# Patient Record
Sex: Female | Born: 1995 | Race: White | Hispanic: No | Marital: Married | Smoking: Never smoker
Health system: Southern US, Community
[De-identification: ages and names within clinical notes are randomized; demographics above are authoritative.]

## PROBLEM LIST (undated history)

## (undated) DIAGNOSIS — F909 Attention-deficit hyperactivity disorder, unspecified type: Secondary | ICD-10-CM

## (undated) DIAGNOSIS — G43909 Migraine, unspecified, not intractable, without status migrainosus: Secondary | ICD-10-CM

## (undated) HISTORY — PX: TONSILLECTOMY: SUR1361

## (undated) HISTORY — PX: NO PAST SURGERIES: SHX2092

---
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
## 2013-02-28 ENCOUNTER — Emergency Department (HOSPITAL_COMMUNITY)
Admission: EM | Admit: 2013-02-28 | Discharge: 2013-03-01 | Disposition: A | Discharge status: Home / Self Care | Payer: BC Managed Care – PPO | Attending: Emergency Medicine | Admitting: Emergency Medicine

## 2013-02-28 ENCOUNTER — Encounter (HOSPITAL_COMMUNITY): Payer: Self-pay | Admitting: Emergency Medicine

## 2013-02-28 DIAGNOSIS — F909 Attention-deficit hyperactivity disorder, unspecified type: Secondary | ICD-10-CM | POA: Insufficient documentation

## 2013-02-28 DIAGNOSIS — R000 Tachycardia, unspecified: Secondary | ICD-10-CM | POA: Insufficient documentation

## 2013-02-28 DIAGNOSIS — F410 Panic disorder [episodic paroxysmal anxiety] without agoraphobia: Secondary | ICD-10-CM | POA: Insufficient documentation

## 2013-02-28 DIAGNOSIS — G43909 Migraine, unspecified, not intractable, without status migrainosus: Secondary | ICD-10-CM | POA: Insufficient documentation

## 2013-02-28 DIAGNOSIS — Z3202 Encounter for pregnancy test, result negative: Secondary | ICD-10-CM | POA: Insufficient documentation

## 2013-02-28 DIAGNOSIS — F1210 Cannabis abuse, uncomplicated: Secondary | ICD-10-CM | POA: Insufficient documentation

## 2013-02-28 DIAGNOSIS — Z79899 Other long term (current) drug therapy: Secondary | ICD-10-CM | POA: Insufficient documentation

## 2013-02-28 HISTORY — DX: Migraine, unspecified, not intractable, without status migrainosus: G43.909

## 2013-02-28 HISTORY — DX: Attention-deficit hyperactivity disorder, unspecified type: F90.9

## 2013-02-28 LAB — POCT I-STAT, CHEM 8
BUN: 12 mg/dL (ref 6–23)
Calcium, Ion: 1.17 mmol/L (ref 1.12–1.23)
Chloride: 107 mEq/L (ref 96–112)
Creatinine, Ser: 1 mg/dL (ref 0.50–1.10)
Glucose, Bld: 101 mg/dL — ABNORMAL HIGH (ref 70–99)
HCT: 45 % (ref 36.0–46.0)
Hemoglobin: 15.3 g/dL — ABNORMAL HIGH (ref 12.0–15.0)
Potassium: 3.5 mEq/L (ref 3.5–5.1)
Sodium: 142 mEq/L (ref 135–145)
TCO2: 19 mmol/L (ref 0–100)

## 2013-02-28 LAB — POCT PREGNANCY, URINE: Preg Test, Ur: NEGATIVE

## 2013-02-28 MED ORDER — LORAZEPAM 1 MG PO TABS
1.0000 mg | ORAL_TABLET | Freq: Once | ORAL | Status: AC
  Administered 2013-02-28: 1 mg via ORAL
  Filled 2013-02-28: qty 1

## 2013-02-28 MED ORDER — SODIUM CHLORIDE 0.9 % IV BOLUS (SEPSIS)
1000.0000 mL | Freq: Once | INTRAVENOUS | Status: AC
  Administered 2013-02-28: 1000 mL via INTRAVENOUS

## 2013-02-28 NOTE — ED Provider Notes (Signed)
CSN: [id number]     Arrival date & time 02/28/13  2218 History   First MD Initiated Contact with Patient 02/28/13 2300     Chief Complaint  Patient presents with  . Hyperventilating   (Consider location/radiation/quality/duration/timing/severity/associated sxs/prior Treatment) HPI  17 year old female with history of ADHD presents for evaluations of hyperventilating. Patient reports for the past 3 days she has had intermittent sensation of heart racing, chest discomfort, and difficulty catching her breath and having panic attack.  these episodes usually happen after she smoked marijuana.  Usually an episode would last for half an hr and resolved but this time it prolonged for several hrs.  She has been smoking the same batch of weed and unsure if it was laced.  She has been taking her Vivanse medication which were prescribed to her.  She denies alcohol abuse or other rec drug use.  Denies family hx of premature cardiac death, denies exertional syncope.  No hx of birth control use, recent surgery, prolonged bed rest, cancer, or prior PE.  Pt also report not eating and drinking as much lately, but denies abd pain.  Denies fever, chills, productive cough, hemoptysis, dysuria, or rash.    Past Medical History  Diagnosis Date  . ADHD (attention deficit hyperactivity disorder)   . Migraines    History reviewed. No pertinent past surgical history. No family history on file. History  Substance Use Topics  . Smoking status: Never Smoker   . Smokeless tobacco: Never Used  . Alcohol Use: No   OB History   Grav Para Term Preterm Abortions TAB SAB Ect Mult Living                 Review of Systems  All other systems reviewed and are negative.    Allergies  Codeine  Home Medications   Current Outpatient Rx  Name  Route  Sig  Dispense  Refill  . amitriptyline (ELAVIL) 50 MG tablet   Oral   Take 50 mg by mouth at bedtime.         Marland Kitchen lisdexamfetamine (VYVANSE) 60 MG capsule   Oral  Take 60 mg by mouth every morning.         . rizatriptan (MAXALT) 10 MG tablet   Oral   Take 10 mg by mouth as needed for migraine. May repeat in 2 hours if needed          BP 126/84  Pulse 124  Resp 42  Ht 5\' 4"  (1.626 m)  Wt 116 lb (52.617 kg)  BMI 19.90 kg/m2  SpO2 100%  LMP 02/26/2013 Physical Exam  Nursing note and vitals reviewed. Constitutional: She is oriented to person, place, and time. She appears well-developed and well-nourished. No distress (appears uncomfortable, anxious).  HENT:  Head: Atraumatic.  Eyes: Conjunctivae are normal.  Neck: Neck supple.  Cardiovascular: Intact distal pulses.   Tachypneic, tachycardic without M/R/G  Pulmonary/Chest:  Tachypneic, but without R/R/W  Abdominal: Soft. She exhibits no distension. There is no tenderness. There is no rebound.  Musculoskeletal: She exhibits no edema.  Neurological: She is alert and oriented to person, place, and time.  Skin: No rash noted.  Psychiatric: She has a normal mood and affect.    ED Course  Procedures (including critical care time)   Date: 03/01/2013  Rate: 120  Rhythm: sinus tachycardia  QRS Axis: normal  Intervals: normal  ST/T Wave abnormalities: normal  Conduction Disutrbances:none  Narrative Interpretation:   Old EKG Reviewed: none available  11:34 PM Pt with anxiety/panic attack and is tachycardic after smoking weed. Ativan given.  ?laced marijuana.  Will check pregnancy test, H&H, UDS and give IVF while monitoring pt closely.  Low suspicion for PE or dissection as a cause.  Sxs may also be related to taking vyvanse however pt has been taking this medication without side effects.    12:19 AM EKG shows sinus tach only with no evidence of arrhythmia. Pregnancy test is negative. Normal H&H and normal electrolytes. Patient now is back to her normal baseline and felt much better. Moderate time was spent discussing avoidance of marijuana use.  Pt also need to f/u with PCP for  thyroid check.  Return precaution discussed.  Care discussed with Dr. Dierdre Highman.    Labs Review Labs Reviewed  POCT I-STAT, CHEM 8 - Abnormal; Notable for the following:    Glucose, Bld 101 (*)    Hemoglobin 15.3 (*)    All other components within normal limits  URINE RAPID DRUG SCREEN (HOSP PERFORMED)  POCT PREGNANCY, URINE   Imaging Review No results found.  EKG Interpretation   None       MDM   1. Tachycardia   2. Panic attack    BP 126/84  Pulse 124  Resp 42  Ht 5\' 4"  (1.626 m)  Wt 116 lb (52.617 kg)  BMI 19.90 kg/m2  SpO2 100%  LMP 02/26/2013     Fayrene Helper, PA-C 03/01/13 0030

## 2013-02-28 NOTE — ED Notes (Signed)
Pt's friend states that the pt began hyperventilating when she left his house. She called him panicked. Pt takes Vyvance daily. States that pt smoked marijuana and that this has happened before.

## 2013-03-01 LAB — RAPID URINE DRUG SCREEN, HOSP PERFORMED
Amphetamines: NOT DETECTED
Barbiturates: NOT DETECTED
Benzodiazepines: NOT DETECTED
Cocaine: NOT DETECTED
Opiates: NOT DETECTED
Tetrahydrocannabinol: NOT DETECTED

## 2013-03-01 NOTE — ED Provider Notes (Signed)
Medical screening examination/treatment/procedure(s) were performed by non-physician practitioner and as supervising physician I was immediately available for consultation/collaboration.    Sunnie Nielsen, MD 03/01/13 [phone]

## 2013-10-13 ENCOUNTER — Encounter: Payer: Self-pay | Admitting: Sports Medicine

## 2013-10-13 ENCOUNTER — Ambulatory Visit (INDEPENDENT_AMBULATORY_CARE_PROVIDER_SITE_OTHER): Payer: BC Managed Care – PPO | Admitting: Sports Medicine

## 2013-10-13 VITALS — BP 116/72 | Ht 64.0 in | Wt 125.0 lb

## 2013-10-13 DIAGNOSIS — M546 Pain in thoracic spine: Secondary | ICD-10-CM | POA: Insufficient documentation

## 2013-10-13 MED ORDER — CYCLOBENZAPRINE HCL 10 MG PO TABS: 10.0000 mg | ORAL_TABLET | Freq: Every evening | ORAL | Status: DC | PRN

## 2013-10-13 NOTE — Patient Instructions (Signed)
Use aleve for pain if needed  Use flexeril at night unless side effects - for at least 2 weeks  Let's try manipulation for upper back  Heat is probably more helpful than ice at this phase  Start scapular exercises

## 2013-10-13 NOTE — Assessment & Plan Note (Signed)
I believe this is facet joint related in the thoracic spine and may respond to manipulation  I will bring her back to a manipulation clinic  We will start her with some scapular stabilization exercises  Trial on Flexeril at night
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
## 2013-11-12 ENCOUNTER — Ambulatory Visit: Payer: BC Managed Care – PPO | Admitting: Sports Medicine

## 2013-11-20 ENCOUNTER — Encounter: Payer: Self-pay | Admitting: Sports Medicine

## 2013-11-20 ENCOUNTER — Ambulatory Visit (INDEPENDENT_AMBULATORY_CARE_PROVIDER_SITE_OTHER): Payer: BC Managed Care – PPO | Admitting: Sports Medicine

## 2013-11-20 VITALS — BP 122/81 | HR 92 | Ht 64.0 in | Wt 120.0 lb

## 2013-11-20 DIAGNOSIS — M546 Pain in thoracic spine: Secondary | ICD-10-CM | POA: Diagnosis not present

## 2013-11-20 DIAGNOSIS — M9902 Segmental and somatic dysfunction of thoracic region: Secondary | ICD-10-CM

## 2013-11-20 NOTE — Patient Instructions (Signed)
OMT Exam: T4 extended, rotated and sidebent right, T9 extended, rotated and sidebent right. Treated with soft tissue, muscle energy, and HVLA to thoracic with good relief of symptoms. Plan for follow-up with PT and/or home stretching regimen, low weight/high repetition upper back strengthening.  Muscle Spasms Muscle cramps and spasms occur when a muscle or muscles tighten and you have no control over this tightening (involuntary muscle contraction). They are a common problem and can develop in any muscle.  Muscle spasms may or may not be painful. They may also last just a few seconds or much longer. CAUSES  It is uncommon for spasms to be due to a serious underlying problem. In many cases, the cause of  spasm is unknown, but can be due to these factors. Some common causes are:   Overexertion.   Overuse from repetitive motions (doing the same thing over and over).   Remaining in a certain position for a long period of time.   Improper preparation, form, or technique while performing a sport or activity.   Dehydration.   Injury.   Side effects of some medicines.   Abnormally low levels of the salts and ions in your blood (electrolytes), especially potassium and calcium. This could happen if you are taking water pills (diuretics) or you are pregnant.  Some underlying medical problems can make it more likely to develop cramps or spasms. These include, but are not limited to:   Diabetes.   Parkinson disease.   Hormone disorders, such as thyroid problems.   Alcohol abuse.   Diseases specific to muscles, joints, and bones.   Blood vessel disease where not enough blood is getting to the muscles.  HOME CARE INSTRUCTIONS   Stay well hydrated. Drink enough water and fluids to keep your urine clear or pale yellow.  It may be helpful to massage, stretch, and relax the affected muscle.  For tight or tense muscles, use a warm towel, heating pad, or hot shower water directed  to the affected area.  If you are sore or have pain after a cramp or spasm, applying ice to the affected area may relieve discomfort.  Put ice in a plastic bag.  Place a towel between your skin and the bag.  Leave the ice on for 15-20 minutes, 03-04 times a day.  Medicines used to treat a known cause of cramps or spasms may help reduce their frequency or severity. Only take over-the-counter or prescription medicines as directed by your caregiver. SEEK MEDICAL CARE IF:  Your cramps or spasms get more severe, more frequent, or do not improve over time.  MAKE SURE YOU:   Understand these instructions.  Will watch your condition.  Will get help right away if you are not doing well or get worse. Document Released: 09/08/2001 Document Revised: 07/14/2012 Document Reviewed: 03/05/2012 [facility] Patient Information 2015 [employer], [place]. This information is not intended to replace advice given to you by your health care provider. Make sure you discuss any questions you have with your health care provider.
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
## 2017-03-18 ENCOUNTER — Telehealth: Payer: Self-pay | Admitting: *Deleted

## 2017-03-18 ENCOUNTER — Ambulatory Visit: Payer: Self-pay | Admitting: Neurology

## 2017-03-18 NOTE — Telephone Encounter (Signed)
Pt no show new pt appt on 03/18/2017 @ 10:00

## 2017-03-27 ENCOUNTER — Encounter: Payer: Self-pay | Admitting: Neurology

## 2020-05-24 ENCOUNTER — Telehealth: Payer: Self-pay | Admitting: Neurology

## 2020-05-24 NOTE — Telephone Encounter (Signed)
Jade Curtis, Dr. Kateri Plummer has referred patient to me for migraines. Can we get her set up sometime in the next month? 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 If I don't have anything (check my Monday afternoon emg spots) then let me know thanks!

## 2020-05-25 NOTE — Telephone Encounter (Signed)
You can just set up the appointment, I don;t need the notes it is fine thanks

## 2020-05-25 NOTE — Telephone Encounter (Signed)
Thank you :)

## 2020-06-13 ENCOUNTER — Encounter: Payer: Self-pay | Admitting: Neurology

## 2020-06-13 ENCOUNTER — Telehealth: Payer: Self-pay | Admitting: Neurology

## 2020-06-13 ENCOUNTER — Ambulatory Visit: Payer: BC Managed Care – PPO | Admitting: Neurology

## 2020-06-13 VITALS — BP 123/69 | HR 95 | Ht 64.0 in | Wt 133.0 lb

## 2020-06-13 DIAGNOSIS — G43709 Chronic migraine without aura, not intractable, without status migrainosus: Secondary | ICD-10-CM | POA: Diagnosis not present

## 2020-06-13 MED ORDER — RIZATRIPTAN BENZOATE 10 MG PO TABS: 10.0000 mg | ORAL_TABLET | ORAL | 11 refills | Status: DC | PRN

## 2020-06-13 NOTE — Telephone Encounter (Signed)
Please initiate botox for migraine approval. She can stay with me for the procedure. G43.709

## 2020-06-13 NOTE — Patient Instructions (Signed)
OnabotulinumtoxinA injection (Medical Use) What is this medicine? ONABOTULINUMTOXINA (o na BOTT you lye num tox in eh) is a neuro-muscular blocker. This medicine is used to treat crossed eyes, eyelid spasms, severe neck muscle spasms, ankle and toe muscle spasms, and elbow, wrist, and finger muscle spasms. It is also used to treat excessive underarm sweating, to prevent chronic migraine headaches, and to treat loss of bladder control due to neurologic conditions such as multiple sclerosis or spinal cord injury. This medicine may be used for other purposes; ask your health care provider or pharmacist if you have questions. COMMON BRAND NAME(S): Botox What should I tell my health care provider before I take this medicine? They need to know if you have any of these conditions:  breathing problems  cerebral palsy spasms  difficulty urinating  heart problems  history of surgery where this medicine is going to be used  infection at the site where this medicine is going to be used  myasthenia gravis or other neurologic disease  nerve or muscle disease  surgery plans  take medicines that treat or prevent blood clots  thyroid problems  an unusual or allergic reaction to botulinum toxin, albumin, other medicines, foods, dyes, or preservatives  pregnant or trying to get pregnant  breast-feeding How should I use this medicine? This medicine is for injection into a muscle. It is given by a health care professional in a hospital or clinic setting. Talk to your pediatrician regarding the use of this medicine in children. While this drug may be prescribed for children as young as 2 years old for selected conditions, precautions do apply. Overdosage: If you think you have taken too much of this medicine contact a poison control center or emergency room at once. NOTE: This medicine is only for you. Do not share this medicine with others. What if I miss a dose? This does not apply. What may  interact with this medicine?  aminoglycoside antibiotics like gentamicin, neomycin, tobramycin  muscle relaxants  other botulinum toxin injections This list may not describe all possible interactions. Give your health care provider a list of all the medicines, herbs, non-prescription drugs, or dietary supplements you use. Also tell them if you smoke, drink alcohol, or use illegal drugs. Some items may interact with your medicine. What should I watch for while using this medicine? Visit your doctor for regular check ups. This medicine will cause weakness in the muscle where it is injected. Tell your doctor if you feel unusually weak in other muscles. Get medical help right away if you have problems with breathing, swallowing, or talking. This medicine might make your eyelids droop or make you see blurry or double. If you have weak muscles or trouble seeing do not drive a car, use machinery, or do other dangerous activities. This medicine contains albumin from human blood. It may be possible to pass an infection in this medicine, but no cases have been reported. Talk to your doctor about the risks and benefits of this medicine. If your activities have been limited by your condition, go back to your regular routine slowly after treatment with this medicine. What side effects may I notice from receiving this medicine? Side effects that you should report to your doctor or health care professional as soon as possible:  allergic reactions like skin rash, itching or hives, swelling of the face, lips, or tongue  breathing problems  changes in vision  chest pain or tightness  eye irritation, pain  fast, irregular heartbeat    infection  numbness  speech problems  swallowing problems  unusual weakness Side effects that usually do not require medical attention (report to your doctor or health care professional if they continue or are bothersome):  bruising or pain at site where  injected  drooping eyelid  dry eyes or mouth  headache  muscles aches, pains  sensitivity to light  tearing This list may not describe all possible side effects. Call your doctor for medical advice about side effects. You may report side effects to FDA at 1-800-FDA-1088. Where should I keep my medicine? This drug is given in a hospital or clinic and will not be stored at home. NOTE: This sheet is a summary. It may not cover all possible information. If you have questions about this medicine, talk to your doctor, pharmacist, or health care provider.  2021 Elsevier/Gold Standard (2017-09-23 14:21:42)  

## 2020-06-13 NOTE — Progress Notes (Signed)
[facility]    Provider:  Dr Lucia Gaskins Requesting Provider: No ref. provider found Primary Care Provider:  Patient, [name]  CC:  Migraines  HPI:  Jade Curtis is a 25 y.o. female here as requested by No ref. provider found for migraines. Since a child. They were wors ein elementary school, vomiting, went to many urgent cares as a child. Over the years she ahs learned how to handle it better. She tries to drink water. She had to take a maxalt every day last week, weather triggered more migraines, also her period can trigger a migraine. Starts unilaterally, pulsating/pounding/throbbing, osmophobia, nausea, can last 24 hours, a strong scent can make it worse, she used to vomit. Over the last 6 months, she has 15 headache days a month and 8 migraine days a month that are moderately severe to severe, no aura, no medication overuse. She is not planning on children anytime soon, however she is no on birth control and so this may limit our choice of medications. No other focal neurologic deficits, associated symptoms, inciting events or modifiable factors.No vision changes. Not positional. No new symptoms, quality stable.   She has tried Topamax for several years with side effects, Depakote for several years(gained excessive weight), Zomig, Maxalt (still takes and it works well for her), Imitrex nasal spray, Ibuprofen, Tylenol, Excedrin migraine, Aleve, Tylenol cold and sinus, Sudafed, amitriptyline(excessive fatigue), diclofenac, flexeril, verapamil(side effect hypotension), propranolol (side effect hypotension), Ajovy/Aimovig/Emgality contraindicated as she is not on birth control and sexually active  Reviewed notes, labs and imaging from outside physicians, which showed: see above  Review of Systems: Patient complains of symptoms per HPI as well as the following symptoms: headache. Pertinent negatives and positives per HPI. All others negative.   Social History    Socioeconomic History  . Marital status: Single    Spouse name: Not on file  . Number of children: Not on file  . Years of education: Not on file  . Highest education level: Not on file  Occupational History  . Not on file  Tobacco Use  . Smoking status: Never Smoker  . Smokeless tobacco: Never Used  Vaping Use  . Vaping Use: Never used  Substance and Sexual Activity  . Alcohol use: Yes    Comment: occasionally   . Drug use: Not Currently    Types: Marijuana  . Sexual activity: Not on file  Other Topics Concern  . Not on file  Social History Narrative   Lives with her fiance'    Right handed   Caffeine: red bull x 1 per day (or similar drink)   Social Determinants of Health   Financial Resource Strain: Not on file  Food Insecurity: Not on file  Transportation Needs: Not on file  Physical Activity: Not on file  Stress: Not on file  Social Connections: Not on file  Intimate Partner Violence: Not on file    Family History  Problem Relation Age of Onset  . Migraines Mother   . Kidney disease Father   . Migraines Maternal Grandmother     Past Medical History:  Diagnosis Date  . ADHD (attention deficit hyperactivity disorder)   . Migraines     Patient Active Problem List   Diagnosis Date Noted  . Chronic migraine without aura without status migrainosus, not intractable 06/13/2020  . Midline thoracic back pain 10/13/2013  . Calf tenderness 08/08/2010  . FOOT PAIN, BILATERAL 12/15/2008  . GENU VALGUM 12/15/2008  . ABNORMALITY OF GAIT  12/15/2008    Past Surgical History:  Procedure Laterality Date  . NO PAST SURGERIES      Current Outpatient Medications  Medication Sig Dispense Refill  . amphetamine-dextroamphetamine (ADDERALL XR) 30 MG 24 hr capsule Take 30 mg by mouth daily.    Marland Kitchen amphetamine-dextroamphetamine (ADDERALL) 15 MG tablet Take 1 tablet by mouth daily.    Marland Kitchen doxycycline (VIBRA-TABS) 100 MG tablet Take 100 mg by mouth daily.    Marland Kitchen guanFACINE  (TENEX) 1 MG tablet Take 1 mg by mouth daily.    Marland Kitchen spironolactone (ALDACTONE) 100 MG tablet Take 100 mg by mouth daily.    . rizatriptan (MAXALT) 10 MG tablet Take 1 tablet (10 mg total) by mouth as needed for migraine. May repeat in 2 hours if needed 9 tablet 11   No current facility-administered medications for this visit.    Allergies as of 06/13/2020 - Review Complete 06/13/2020  Allergen Reaction Noted  . Codeine  02/28/2013    Vitals: BP 123/69 (BP Location: Right Arm, Patient Position: Sitting)   Pulse 95   Ht 5\' 4"  (1.626 m)   Wt 133 lb (60.3 kg)   BMI 22.83 kg/m  Last Weight:  Wt Readings from Last 1 Encounters:  06/13/20 133 lb (60.3 kg)   Last Height:   Ht Readings from Last 1 Encounters:  06/13/20 5\' 4"  (1.626 m)     Physical exam: Exam: Gen: NAD, conversant, well nourised, well groomed                     CV: RRR, no MRG. No Carotid Bruits. No peripheral edema, warm, nontender Eyes: Conjunctivae clear without exudates or hemorrhage  Neuro: Detailed Neurologic Exam  Speech:    Speech is normal; fluent and spontaneous with normal comprehension.  Cognition:    The patient is oriented to person, place, and time;     recent and remote memory intact;     language fluent;     normal attention, concentration,     fund of knowledge Cranial Nerves:    The pupils are equal, round, and reactive to light. The fundi are flat. Visual fields are full to finger confrontation. Extraocular movements are intact. Trigeminal sensation is intact and the muscles of mastication are normal. The face is symmetric. The palate elevates in the midline. Hearing intact. Voice is normal. Shoulder shrug is normal. The tongue has normal motion without fasciculations.   Coordination:    No dysmetria or ataxia  Gait:    Normal native gait  Motor Observation:    No asymmetry, no atrophy, and no involuntary movements noted. Tone:    Normal muscle tone.    Posture:    Posture is  normal. normal erect    Strength:    Strength is V/V in the upper and lower limbs.      Sensation: intact to LT     Reflex Exam:  DTR's:    Deep tendon reflexes in the upper and lower extremities are normal bilaterally.   Toes:    The toes are downgoing bilaterally.   Clonus:    Clonus is absent.    Assessment/Plan:  26 year old with chronic migraines failed multiple medications. We discussed options. Limiting factor is that although she is not planning on children anytime soon she is also not using birth control due to side effects to birth control. In that case the safest option would be botox for migraines.  Botox: prevention Acute: maxalt, continue No  indication for imaging  No orders of the defined types were placed in this encounter.  Meds ordered this encounter  Medications  . rizatriptan (MAXALT) 10 MG tablet    Sig: Take 1 tablet (10 mg total) by mouth as needed for migraine. May repeat in 2 hours if needed    Dispense:  9 tablet    Refill:  11    Dispense 9 or 18 or max allowed by insurance    Cc: No ref. provider found,  Patient, [name]  I spent 45 minutes of face-to-face and non-face-to-face time with patient on the  1. Chronic migraine without aura without status migrainosus, not intractable    diagnosis.  This included previsit chart review, lab review, study review, order entry, electronic health record documentation, patient education on the different diagnostic and therapeutic options, counseling and coordination of care, risks and benefits of management, compliance, or risk factor reduction  Naomie Dean, MD  [facility] [address] [place], [place] [address]  Phone [phone] Fax [phone]

## 2020-06-14 NOTE — Telephone Encounter (Signed)
Received charge sheet for Botox. Filled out [employer] PA form and gave to MD to sign.

## 2020-06-14 NOTE — Telephone Encounter (Signed)
Botox charge sheet completed and is pending MD signature. Pt needs to sign consent at first injection appt. 

## 2020-06-14 NOTE — Telephone Encounter (Signed)
Faxed signed PA form to [employer] with notes. 

## 2020-06-16 NOTE — Telephone Encounter (Signed)
Jade Curtis: Let patient know that insurance will not approve botox until she has tried the CGRP medications. But she is not using birth control and this is risky because we do not have much information on effects of these medications on a fetus. We could try Qulipta but patient would need to use some sort of birth control (even condoms or a barrier device).   [name] - does qulipta count or does it have to be ajovy/emgality/aimovig?

## 2020-06-16 NOTE — Telephone Encounter (Signed)
Received denial from [employer] stating "This medication may be covered when the member has tried and failed therapy with a CGRP inhibitor such as Emgality or Aimovig to prevent migraines. In this case, medical records do not confirm the member has tried and failed a CGRP."  Patient's office notes from 3/14 were included. The office notes state that a CGRP Inhibitor contraindicates as patient is not on birth control and is sexually active.  Please advise how to proceed.

## 2020-06-20 NOTE — Telephone Encounter (Signed)
The letter indicates Ajovy/Aimovig/Emgality

## 2020-06-20 NOTE — Telephone Encounter (Signed)
Spoke with patient we discussed the options given Botox has been denied by her insurance and the requirement is to try [employer] or Aimovig.  Patient is aware she would need to use some sort of birth control to be on the CGRP injection. The patient's questions were answered and she is going to think about it and give [place] a call back probably tomorrow.

## 2020-09-08 ENCOUNTER — Telehealth: Payer: Self-pay | Admitting: Neurology

## 2020-09-08 DIAGNOSIS — G43709 Chronic migraine without aura, not intractable, without status migrainosus: Secondary | ICD-10-CM

## 2020-09-08 NOTE — Telephone Encounter (Signed)
Pt has called for a refill on her Nurtec to [facility] [phone]

## 2020-09-12 MED ORDER — NURTEC 75 MG PO TBDP: 75.0000 mg | ORAL_TABLET | Freq: Every day | ORAL | 2 refills | Status: DC | PRN

## 2020-09-12 NOTE — Telephone Encounter (Signed)
Spoke with Dr Lucia Gaskins. Ok to provide prescription for Nurtec 75 mg PRN use. Will send to [facility] pharmacy. I spoke with the patient and discussed the plan. She is aware to watch for a call from [facility] pharmacy Nurtec [employer] program. She verbalized appreciation for the call. Nurtec Rx sent to [facility] pharmacy.

## 2020-09-12 NOTE — Addendum Note (Signed)
Addended by: Bertram Savin on: 09/12/2020 01:36 PM   Modules accepted: Orders

## 2020-10-11 ENCOUNTER — Telehealth: Payer: Self-pay | Admitting: *Deleted

## 2020-10-11 NOTE — Telephone Encounter (Signed)
Received PA form for Nurtec from [facility] pharmacy. PA form completed, signed, and faxed to [facility] pharmacy. Received a receipt of confirmation.

## 2020-10-13 NOTE — Telephone Encounter (Signed)
Received approval letter from [facility]. Nurtec approved through 10/12/21. Faxed approval letter to [facility] pharmacy. Received a receipt of confirmation.

## 2020-11-23 ENCOUNTER — Other Ambulatory Visit: Payer: Self-pay | Admitting: Neurology

## 2020-11-23 DIAGNOSIS — G43709 Chronic migraine without aura, not intractable, without status migrainosus: Secondary | ICD-10-CM

## 2020-11-24 ENCOUNTER — Other Ambulatory Visit: Payer: Self-pay | Admitting: *Deleted

## 2020-11-24 MED ORDER — NURTEC 75 MG PO TBDP: ORAL_TABLET | ORAL | 3 refills | Status: DC

## 2021-06-27 ENCOUNTER — Other Ambulatory Visit: Payer: Self-pay | Admitting: Neurology

## 2021-07-25 ENCOUNTER — Other Ambulatory Visit: Payer: Self-pay | Admitting: Neurology

## 2021-08-10 ENCOUNTER — Telehealth: Payer: Self-pay | Admitting: Neurology

## 2021-08-10 MED ORDER — RIZATRIPTAN BENZOATE 10 MG PO TABS
ORAL_TABLET | ORAL | 0 refills | Status: DC
Start: 2021-08-10 — End: 2021-09-07

## 2021-08-10 NOTE — Telephone Encounter (Signed)
Pt request refill for rizatriptan (MAXALT) 10 MG tablet at [facility] [phone] ?

## 2021-08-10 NOTE — Telephone Encounter (Signed)
Done

## 2021-09-07 ENCOUNTER — Other Ambulatory Visit: Payer: Self-pay | Admitting: Neurology

## 2021-10-12 ENCOUNTER — Telehealth: Payer: Self-pay | Admitting: Neurology

## 2021-10-12 ENCOUNTER — Ambulatory Visit: Payer: BC Managed Care – PPO | Admitting: Neurology

## 2021-10-12 NOTE — Telephone Encounter (Signed)
LVM and mychart msg informing pt of need to reschedule 7/13 appointment - MD out 

## 2021-10-31 LAB — OB RESULTS CONSOLE ABO/RH: RH Type: NEGATIVE

## 2021-10-31 LAB — HEPATITIS C ANTIBODY: HCV Ab: NEGATIVE

## 2021-10-31 LAB — OB RESULTS CONSOLE RPR: RPR: NONREACTIVE

## 2021-10-31 LAB — OB RESULTS CONSOLE ANTIBODY SCREEN: Antibody Screen: NEGATIVE

## 2021-10-31 LAB — OB RESULTS CONSOLE GC/CHLAMYDIA
Chlamydia: NEGATIVE
Neisseria Gonorrhea: NEGATIVE

## 2021-10-31 LAB — OB RESULTS CONSOLE RUBELLA ANTIBODY, IGM: Rubella: IMMUNE

## 2021-10-31 LAB — OB RESULTS CONSOLE HIV ANTIBODY (ROUTINE TESTING): HIV: NONREACTIVE

## 2021-10-31 LAB — OB RESULTS CONSOLE HEPATITIS B SURFACE ANTIGEN: Hepatitis B Surface Ag: NEGATIVE

## 2022-01-01 ENCOUNTER — Encounter: Payer: Self-pay | Admitting: Neurology

## 2022-01-01 ENCOUNTER — Ambulatory Visit: Payer: BC Managed Care – PPO | Admitting: Neurology

## 2022-01-01 VITALS — BP 119/72 | HR 88 | Ht 64.0 in | Wt 188.0 lb

## 2022-01-01 DIAGNOSIS — G43709 Chronic migraine without aura, not intractable, without status migrainosus: Secondary | ICD-10-CM

## 2022-01-01 MED ORDER — METOCLOPRAMIDE HCL 10 MG PO TABS: 10.0000 mg | ORAL_TABLET | Freq: Four times a day (QID) | ORAL | 3 refills | Status: AC

## 2022-01-01 NOTE — Patient Instructions (Signed)
Metoclopramide Tablets What is this medication? METOCLOPRAMIDE (met oh kloe PRA mide) treats reflux disease. It is prescribed when other medications have not worked. It may also be used to treat slow emptying of the digestive tract. It works by helping the muscles in your digestive tract move food. This empties your digestive tract, which relieves symptoms such as fullness, nausea, and heartburn. This medicine may be used for other purposes; ask your health care provider or pharmacist if you have questions. COMMON BRAND NAME(S): Reglan What should I tell my care team before I take this medication? They need to know if you have any of these conditions: Breast cancer Depression Diabetes Frequently drink alcohol Heart failure High blood pressure Kidney disease Liver disease Parkinson's disease or a movement disorder Pheochromocytoma Seizures Stomach obstruction, bleeding, or perforation An unusual or allergic reaction to metoclopramide, procainamide, other medications, foods, dyes, or preservatives Pregnant or trying to get pregnant Breast-feeding How should I use this medication? Take this medication by mouth with a glass of water. Follow the directions on the prescription label. Take this medication on an empty stomach, about 30 minutes before eating. Take your doses at regular intervals. Do not take your medication more often than directed. Do not stop taking except on the advice of your care team. A special MedGuide will be given to you by the pharmacist with each prescription and refill. Be sure to read this information carefully each time. Talk to your care team about the use of this medication in children. Special care may be needed. Overdosage: If you think you have taken too much of this medicine contact a poison control center or emergency room at once. NOTE: This medicine is only for you. Do not share this medicine with others. What if I miss a dose? If you miss a dose, skip it.  Take your next dose at the normal time. Do not take extra or 2 doses at the same time to make up for the missed dose. What may interact with this medication? Alcohol Antihistamines for allergy, cough, and cold Atovaquone Atropine Bupropion Certain medications for anxiety or sleep Certain medications for bladder problems, such as oxybutynin, tolterodine Certain medications for depression or mental health conditions Certain medications for Parkinson's disease Certain medications for seizures, such as phenobarbital, primidone Certain medications for stomach problems, such as dicyclomine, hyoscyamine Certain medications for travel sickness, such as scopolamine Cyclosporine Digoxin Fosfomycin General anesthetics, such as halothane, isoflurane, methoxyflurane, propofol Insulin and other medications for diabetes Ipratropium MAOIs, such as Carbex, Eldepryl, Marplan, Nardil, and Parnate Medications that relax muscles for surgery Opioid medications for pain Paroxetine Phenothiazines, such as chlorpromazine, mesoridazine, prochlorperazine, thioridazine Posaconazole Quinidine Sirolimus Tacrolimus This list may not describe all possible interactions. Give your health care provider a list of all the medicines, herbs, non-prescription drugs, or dietary supplements you use. Also tell them if you smoke, drink alcohol, or use illegal drugs. Some items may interact with your medicine. What should I watch for while using this medication? It may take a few weeks for your stomach condition to start to get better. However, do not take this medication for longer than 12 weeks. The longer you take this medication, and the more you take it, the greater your chances are of developing serious side effects. If you are over 59 years of age, a female patient, or you have diabetes, you may be at an increased risk for side effects from this medication. Contact your care team immediately if you start having movements  you cannot control such as lip smacking, rapid movements of the tongue, involuntary or uncontrollable movements of the eyes, head, arms and legs, or muscle twitches and spasms. Patients and their families should watch out for worsening depression or thoughts of suicide. Also watch out for any sudden or severe changes in feelings such as feeling anxious, agitated, panicky, irritable, hostile, aggressive, impulsive, severely restless, overly excited and hyperactive, or not being able to sleep. If this happens, especially at the beginning of treatment or after a change in dose, call your care team. Do not treat yourself for high fever. Ask your care team for advice. You may get drowsy or dizzy. Do not drive, use machinery, or do anything that needs mental alertness until you know how this medication affects you. Do not stand or sit up quickly, especially if you are over 42 years of age. This reduces the risk of dizzy or fainting spells. Alcohol can make you more drowsy and dizzy. Avoid alcoholic drinks. What side effects may I notice from receiving this medication? Side effects that you should report to your care team as soon as possible: Allergic reactions--skin rash, itching, hives, swelling of the face, lips, tongue, or throat High fever, stiff muscles, increased sweating, fast or irregular heartbeat, and confusion, which may be signs of neuroleptic malignant syndrome High prolactin level--unexpected breast tissue growth, discharge from the nipple, change in sex drive or performance, irregular menstrual cycle Increase in blood pressure Swelling of the ankles, hands, or feet Thoughts of suicide or self-harm, worsening mood, feelings of depression Uncontrolled and repetitive body movements, muscle stiffness or spasms, tremors or shaking, loss of balance or coordination, restlessness, shuffling walk, which may be signs of extrapyramidal symptoms (EPS) Side effects that usually do not require medical  attention (report to your care team if they continue or are bothersome): Dizziness Drowsiness Fatigue Headache Trouble sleeping This list may not describe all possible side effects. Call your doctor for medical advice about side effects. You may report side effects to FDA at 1-800-FDA-1088. Where should I keep my medication? Keep out of the reach of children and pets. Store at room temperature between 20 and 25 degrees C (68 and 77 degrees F). Protect from light. Keep container tightly closed. Throw away any unused medication after the expiration date. NOTE: This sheet is a summary. It may not cover all possible information. If you have questions about this medicine, talk to your doctor, pharmacist, or health care provider.  2023 Elsevier/Gold Standard (2020-11-23 00:00:00)

## 2022-01-01 NOTE — Progress Notes (Signed)
[facility]    Provider:  Dr Jaynee Eagles Requesting Provider: No ref. provider found Primary Care Provider:  Patient, [name]  CC:  Migraines  01/01/2022: She found out the middle of June she was pregnant. The first 2 weeks of July it was very bad but since then she has not really had a migraine.  HPI 06/13/2020:  Jade Curtis is a 26 y.o. female here as requested by No ref. provider found for migraines. Since a child. They were wors ein elementary school, vomiting, went to many urgent cares as a child. Over the years she ahs learned how to handle it better. She tries to drink water. She had to take a maxalt every day last week, weather triggered more migraines, also her period can trigger a migraine. Starts unilaterally, pulsating/pounding/throbbing, osmophobia, nausea, can last 24 hours, a strong scent can make it worse, she used to vomit. Over the last 6 months, she has 15 headache days a month and 8 migraine days a month that are moderately severe to severe, no aura, no medication overuse. She is not planning on children anytime soon, however she is no on birth control and so this may limit our choice of medications. No other focal neurologic deficits, associated symptoms, inciting events or modifiable factors.No vision changes. Not positional. No new symptoms, quality stable.   She has tried Topamax for several years with side effects, Depakote for several years(gained excessive weight), Zomig, Maxalt (still takes and it works well for her), Imitrex nasal spray, Ibuprofen, Tylenol, Excedrin migraine, Aleve, Tylenol cold and sinus, Sudafed, amitriptyline(excessive fatigue), diclofenac, flexeril, verapamil(side effect hypotension), propranolol (side effect hypotension), tried Ajovy, reglan, topiramate, nurtec, ubrelvy  Reviewed notes, labs and imaging from outside physicians, which showed: see above  Review of Systems: Patient complains of symptoms per HPI as well as the  following symptoms: headache. Pertinent negatives and positives per HPI. All others negative.   Social History   Socioeconomic History   Marital status: Married    Spouse name: Not on file   Number of children: Not on file   Years of education: Not on file   Highest education level: Not on file  Occupational History   Not on file  Tobacco Use   Smoking status: Never   Smokeless tobacco: Never  Vaping Use   Vaping Use: Never used  Substance and Sexual Activity   Alcohol use: Not Currently    Comment: occasionally    Drug use: Not Currently    Types: Marijuana   Sexual activity: Not on file  Other Topics Concern   Not on file  Social History Narrative   Lives with her husband    Right handed   Caffeine: coffee and sometimes diet coke in afternoon   Social Determinants of Health   Financial Resource Strain: Not on file  Food Insecurity: Not on file  Transportation Needs: Not on file  Physical Activity: Not on file  Stress: Not on file  Social Connections: Not on file  Intimate Partner Violence: Not on file    Family History  Problem Relation Age of Onset   Migraines Mother    Kidney disease Father    Migraines Maternal Grandmother     Past Medical History:  Diagnosis Date   ADHD (attention deficit hyperactivity disorder)    Migraines     Patient Active Problem List   Diagnosis Date Noted   Chronic migraine without aura without status migrainosus, not intractable 06/13/2020   Midline thoracic  back pain 10/13/2013   Calf tenderness 08/08/2010   FOOT PAIN, BILATERAL 12/15/2008   GENU VALGUM 12/15/2008   ABNORMALITY OF GAIT 12/15/2008    Past Surgical History:  Procedure Laterality Date   NO PAST SURGERIES      Current Outpatient Medications  Medication Sig Dispense Refill   Doxylamine Succinate, Sleep, (UNISOM PO) Take by mouth at bedtime as needed.     MAGNESIUM PO Take by mouth.     metoCLOPramide (REGLAN) 10 MG tablet Take 1 tablet (10 mg total)  by mouth 4 (four) times daily. As needed for migraine. 30 tablet 3   Prenatal Vit-Fe Fumarate-FA (PRENATAL VITAMINS PO) Take by mouth.     No current facility-administered medications for this visit.    Allergies as of 01/01/2022 - Review Complete 01/01/2022  Allergen Reaction Noted   Codeine  02/28/2013    Vitals: BP 119/72 (BP Location: Left Arm, Patient Position: Sitting)   Pulse 88   Ht 5\' 4"  (1.626 m)   Wt 188 lb (85.3 kg)   BMI 32.27 kg/m  Last Weight:  Wt Readings from Last 1 Encounters:  01/01/22 188 lb (85.3 kg)   Last Height:   Ht Readings from Last 1 Encounters:  01/01/22 5\' 4"  (1.626 m)    Exam: NAD, pleasant                  Speech:    Speech is normal; fluent and spontaneous with normal comprehension.  Cognition:    The patient is oriented to person, place, and time;     recent and remote memory intact;     language fluent;    Cranial Nerves:    The pupils are equal, round, and reactive to light.Trigeminal sensation is intact and the muscles of mastication are normal. The face is symmetric. The palate elevates in the midline. Hearing intact. Voice is normal. Shoulder shrug is normal. The tongue has normal motion without fasciculations.   Coordination:  No dysmetria  Motor Observation:    No asymmetry, no atrophy, and no involuntary movements noted. Tone:    Normal muscle tone.     Strength:    Strength is V/V in the upper and lower limbs.      Sensation: intact to LT    Assessment/Plan:  26 year old with chronic migraines failed multiple medications now [redacted] weeks pregnant. We discussed risks in pregnancy, since she is not having many headaches/migraines now (80% of woman in pregnancy have great 2nd and 3rd trimesters) we can give reglan, can also try cyproheptadine or sumatriptan in the future we discussed medication classes and risks.   She has tried Topamax for several years with side effects, Depakote for several years(gained excessive weight),  Zomig, Maxalt (still takes and it works well for her), Imitrex nasal spray, Ibuprofen, Tylenol, Excedrin migraine, Aleve, Tylenol cold and sinus, Sudafed, amitriptyline(excessive fatigue), diclofenac, flexeril, verapamil(side effect hypotension), propranolol (side effect hypotension), tried Ajovy, reglan, topiramate, nurtec(didn't feel it helpes), ubrelvy(she would try again)  In the future we will see her in mid march, if having migraines again she would be interested in botox, we can always try to provide samples while we get it approved  There are limited treatments in pregnancy and all have risks. Tylenol is generally accepted as safe in pregnancy although there has even been evidence for risks with tylenol. Discussed.  Other interventions to try and decreased the use of medication for headache/migraine include:  Cool Compress. Lie down and place a  cool compress on your head.  Avoid headache triggers. If certain foods or odors seem to have triggered your migraines in the past, avoid them. A headache diary might help you identify triggers.  Include physical activity in your daily routine. Try a daily walk or other moderate aerobic exercise.  Manage stress. Find healthy ways to cope with the stressors, such as delegating tasks on your to-do list.  Practice relaxation techniques. Try deep breathing, yoga, massage and visualization.  Eat regularly. Eating regularly scheduled meals and maintaining a healthy diet might help prevent headaches. Also, drink plenty of fluids.  Follow a regular sleep schedule. Sleep deprivation might contribute to headaches  Consider biofeedback. With this mind-body technique, you learn to control certain bodily functions -- such as muscle tension, heart rate and blood pressure -- to prevent headaches or reduce headache pain.  Headaches during pregnancy are common. However, if you develop a severe headache or a headache that doesn't go away, call your health care provider.  Severe headaches can be a sign of a pregnancy complication    Meds ordered this encounter  Medications   metoCLOPramide (REGLAN) 10 MG tablet    Sig: Take 1 tablet (10 mg total) by mouth 4 (four) times daily. As needed for migraine.    Dispense:  30 tablet    Refill:  3    Cc: No ref. provider found,  Patient, [name] Sarina Ill, MD  [facility] [address] [place], [place] [address]  I spent 30 minutes of face-to-face and non-face-to-face time with patient on the  1. Chronic migraine without aura without status migrainosus, not intractable    diagnosis.  This included previsit chart review, lab review, study review, order entry, electronic health record documentation, patient education on the different diagnostic and therapeutic options, counseling and coordination of care, risks and benefits of management, compliance, or risk factor reduction  Phone [phone] Fax [phone]

## 2022-04-02 NOTE — L&D Delivery Note (Addendum)
Delivery Note At 0225 a viable and healthy female was delivered over intact perineum via svd (Presentation:cephalic ;  OA).  APGAR: 8,9 ; weight  not yet done.   Placenta status: delivered spontaneously,intact .  Cord: 3vv with the following complications: nuchal x1, unable to easily reduce but not tight, baby continued to deliver so reduced after delivery w/o difficulty.  Anesthesia:  epidural Episiotomy:  none Lacerations:  small right labial, hemostatic; 2nd degree perineal Suture Repair: 2.0 vicryl Est. Blood Loss (mL):  163m  Mom to postpartum.  Baby to Couplet care / Skin to Skin.  SCharyl Bigger2/22/2024, 2:48 AM

## 2022-04-24 LAB — OB RESULTS CONSOLE GBS: GBS: NEGATIVE

## 2022-05-18 ENCOUNTER — Encounter (HOSPITAL_COMMUNITY): Payer: Self-pay | Admitting: *Deleted

## 2022-05-18 ENCOUNTER — Telehealth (HOSPITAL_COMMUNITY): Payer: Self-pay | Admitting: *Deleted

## 2022-05-18 NOTE — Telephone Encounter (Signed)
Preadmission screen  

## 2022-05-22 ENCOUNTER — Other Ambulatory Visit: Payer: Self-pay | Admitting: Obstetrics

## 2022-05-23 ENCOUNTER — Inpatient Hospital Stay (HOSPITAL_COMMUNITY)
Admission: AD | Admit: 2022-05-23 | Discharge: 2022-05-25 | DRG: 807 | Disposition: A | Discharge status: Home / Self Care | Payer: BC Managed Care – PPO | Attending: Obstetrics and Gynecology | Admitting: Obstetrics and Gynecology

## 2022-05-23 ENCOUNTER — Inpatient Hospital Stay (HOSPITAL_COMMUNITY): Payer: BC Managed Care – PPO | Admitting: Anesthesiology

## 2022-05-23 ENCOUNTER — Encounter (HOSPITAL_COMMUNITY): Payer: Self-pay | Admitting: Obstetrics and Gynecology

## 2022-05-23 DIAGNOSIS — O26893 Other specified pregnancy related conditions, third trimester: Secondary | ICD-10-CM | POA: Diagnosis present

## 2022-05-23 DIAGNOSIS — O480 Post-term pregnancy: Secondary | ICD-10-CM | POA: Diagnosis present

## 2022-05-23 DIAGNOSIS — Z8759 Personal history of other complications of pregnancy, childbirth and the puerperium: Secondary | ICD-10-CM

## 2022-05-23 DIAGNOSIS — Z6791 Unspecified blood type, Rh negative: Secondary | ICD-10-CM

## 2022-05-23 DIAGNOSIS — Z3A40 40 weeks gestation of pregnancy: Secondary | ICD-10-CM | POA: Diagnosis not present

## 2022-05-23 LAB — CBC
HCT: 35.5 % — ABNORMAL LOW (ref 36.0–46.0)
Hemoglobin: 12.1 g/dL (ref 12.0–15.0)
MCH: 30.7 pg (ref 26.0–34.0)
MCHC: 34.1 g/dL (ref 30.0–36.0)
MCV: 90.1 fL (ref 80.0–100.0)
Platelets: 260 10*3/uL (ref 150–400)
RBC: 3.94 MIL/uL (ref 3.87–5.11)
RDW: 12.3 % (ref 11.5–15.5)
WBC: 12.8 10*3/uL — ABNORMAL HIGH (ref 4.0–10.5)
nRBC: 0 % (ref 0.0–0.2)

## 2022-05-23 LAB — TYPE AND SCREEN
ABO/RH(D): A NEG
Antibody Screen: POSITIVE

## 2022-05-23 LAB — POCT FERN TEST: POCT Fern Test: POSITIVE

## 2022-05-23 MED ORDER — LACTATED RINGERS IV SOLN: INTRAVENOUS | Status: DC

## 2022-05-23 MED ORDER — EPHEDRINE 5 MG/ML INJ: 10.0000 mg | INTRAVENOUS | Status: DC | PRN

## 2022-05-23 MED ORDER — LACTATED RINGERS IV SOLN: 500.0000 mL | Freq: Once | INTRAVENOUS | Status: DC

## 2022-05-23 MED ORDER — DIPHENHYDRAMINE HCL 50 MG/ML IJ SOLN: 12.5000 mg | INTRAMUSCULAR | Status: DC | PRN

## 2022-05-23 MED ORDER — SOD CITRATE-CITRIC ACID 500-334 MG/5ML PO SOLN: 30.0000 mL | ORAL | Status: DC | PRN

## 2022-05-23 MED ORDER — LIDOCAINE HCL (PF) 1 % IJ SOLN: 30.0000 mL | INTRAMUSCULAR | Status: DC | PRN

## 2022-05-23 MED ORDER — MISOPROSTOL 25 MCG QUARTER TABLET: 25.0000 ug | ORAL_TABLET | ORAL | Status: DC | PRN

## 2022-05-23 MED ORDER — LACTATED RINGERS IV SOLN: 500.0000 mL | INTRAVENOUS | Status: DC | PRN

## 2022-05-23 MED ORDER — FENTANYL CITRATE (PF) 100 MCG/2ML IJ SOLN
100.0000 ug | INTRAMUSCULAR | Status: DC
  Administered 2022-05-23: 100 ug via INTRAVENOUS
  Filled 2022-05-23: qty 2

## 2022-05-23 MED ORDER — FENTANYL-BUPIVACAINE-NACL 0.5-0.125-0.9 MG/250ML-% EP SOLN: 12.0000 mL/h | EPIDURAL | Status: DC | PRN

## 2022-05-23 MED ORDER — TERBUTALINE SULFATE 1 MG/ML IJ SOLN: 0.2500 mg | Freq: Once | INTRAMUSCULAR | Status: DC | PRN

## 2022-05-23 MED ORDER — LIDOCAINE HCL (PF) 1 % IJ SOLN
INTRAMUSCULAR | Status: DC | PRN
  Administered 2022-05-23: 8 mL via EPIDURAL

## 2022-05-23 MED ORDER — ACETAMINOPHEN 325 MG PO TABS
650.0000 mg | ORAL_TABLET | ORAL | Status: DC | PRN
  Administered 2022-05-24: 650 mg via ORAL
  Filled 2022-05-23: qty 2

## 2022-05-23 MED ORDER — PHENYLEPHRINE 80 MCG/ML (10ML) SYRINGE FOR IV PUSH (FOR BLOOD PRESSURE SUPPORT)
80.0000 ug | PREFILLED_SYRINGE | INTRAVENOUS | Status: DC | PRN
  Filled 2022-05-23: qty 10

## 2022-05-23 MED ORDER — OXYTOCIN BOLUS FROM INFUSION
333.0000 mL | Freq: Once | INTRAVENOUS | Status: AC
  Administered 2022-05-24: 333 mL via INTRAVENOUS

## 2022-05-23 MED ORDER — FENTANYL-BUPIVACAINE-NACL 0.5-0.125-0.9 MG/250ML-% EP SOLN
12.0000 mL/h | EPIDURAL | Status: DC | PRN
  Administered 2022-05-23: 12 mL/h via EPIDURAL
  Filled 2022-05-23: qty 250

## 2022-05-23 MED ORDER — ONDANSETRON HCL 4 MG/2ML IJ SOLN
4.0000 mg | Freq: Four times a day (QID) | INTRAMUSCULAR | Status: DC | PRN
  Administered 2022-05-23: 4 mg via INTRAVENOUS
  Filled 2022-05-23: qty 2

## 2022-05-23 MED ORDER — OXYTOCIN-SODIUM CHLORIDE 30-0.9 UT/500ML-% IV SOLN
2.5000 [IU]/h | INTRAVENOUS | Status: DC
  Filled 2022-05-23: qty 500

## 2022-05-23 MED ORDER — PHENYLEPHRINE 80 MCG/ML (10ML) SYRINGE FOR IV PUSH (FOR BLOOD PRESSURE SUPPORT): 80.0000 ug | PREFILLED_SYRINGE | INTRAVENOUS | Status: DC | PRN

## 2022-05-23 NOTE — Anesthesia Preprocedure Evaluation (Signed)
Anesthesia Evaluation  Patient identified by MRN, date of birth, ID band Patient awake    Reviewed: Allergy & Precautions, H&P , NPO status , Patient's Chart, lab work & pertinent test results, reviewed documented beta blocker date and time   Airway Mallampati: II  TM Distance: >3 FB Neck ROM: full    Dental no notable dental hx. (+) Teeth Intact, Dental Advisory Given   Pulmonary neg pulmonary ROS   Pulmonary exam normal breath sounds clear to auscultation       Cardiovascular negative cardio ROS Normal cardiovascular exam Rhythm:regular Rate:Normal     Neuro/Psych negative neurological ROS  negative psych ROS   GI/Hepatic negative GI ROS, Neg liver ROS,,,  Endo/Other  negative endocrine ROS    Renal/GU negative Renal ROS  negative genitourinary   Musculoskeletal   Abdominal   Peds  Hematology negative hematology ROS (+)   Anesthesia Other Findings   Reproductive/Obstetrics (+) Pregnancy                              Anesthesia Physical Anesthesia Plan  ASA: 2  Anesthesia Plan: Epidural   Post-op Pain Management: Minimal or no pain anticipated   Induction: Intravenous  PONV Risk Score and Plan: 2 and Treatment may vary due to age or medical condition  Airway Management Planned: Natural Airway  Additional Equipment: None  Intra-op Plan:   Post-operative Plan:   Informed Consent: I have reviewed the patients History and Physical, chart, labs and discussed the procedure including the risks, benefits and alternatives for the proposed anesthesia with the patient or authorized representative who has indicated his/her understanding and acceptance.     Dental Advisory Given  Plan Discussed with: Anesthesiologist  Anesthesia Plan Comments: (Labs checked- platelets confirmed with RN in room. Fetal heart tracing, per RN, reported to be stable enough for sitting  procedure. Discussed epidural, and patient consents to the procedure:  included risk of possible headache,backache, failed block, allergic reaction, and nerve injury. This patient was asked if she had any questions or concerns before the procedure started.)         Anesthesia Quick Evaluation

## 2022-05-23 NOTE — MAU Note (Addendum)
Jade Curtis is a 27 y.o. at 57w4dhere in MAU reporting: CTX that started at 144 Pt states the ctx are about every minute. Pt reports LOF that began around 2000. Pt states the fluid is clear with some pink in it. Pt denies VB. +FM   Onset of complaint: 1900 05/23/2022 Pain score: 8/10 lower abdomen  Vitals:   05/23/22 2029  BP: (!) 143/90  Pulse: 99  Resp: 18  Temp: 98.2 F (36.8 C)  SpO2: 100%     FHT:161 Lab orders placed from triage:  FMaryann Alar

## 2022-05-23 NOTE — H&P (Signed)
Jade Curtis is a 27 y.o. G1P0 at 45w4dgestation presents for complaint of Contractions and Loss of fluid. No vb. +FM. Noticed clear fluid around 7p tonight.  Patient noted to be grossly ruptured in MAU. Painfully contracting.  Antepartum course:  s>d, incresed weight gain, bmi >30 ; 7'14" (39%) [place] [facility] [place] [facility]  See complete pre-natal records  History OB History     Gravida  1   Para      Term      Preterm      AB      Living         SAB      IAB      Ectopic      Multiple      Live Births             Past Medical History:  Diagnosis Date   ADHD (attention deficit hyperactivity disorder)    Migraines    Past Surgical History:  Procedure Laterality Date   NO PAST SURGERIES     TONSILLECTOMY     Family History: family history includes Kidney disease in her father; Migraines in her maternal grandmother and mother. Social History:  reports that she has never smoked. She has never used smokeless tobacco. She reports that she does not currently use alcohol. She reports that she does not currently use drugs after having used the following drugs: Marijuana.  ROS: See above otherwise negative  Prenatal labs:  ABO, Rh: A/Negative/-- (08/01 0000) Antibody: Negative (08/01 0000) Rubella: Immune (08/01 0000) RPR: Nonreactive (08/01 0000)  HBsAg: Negative (08/01 0000)  HIV:Non-reactive (08/01 0000)  GBS: Negative/-- (01/23 0000)  1 hr Glucola: Normal Genetic screening: Normal Anatomy [place] Normal  Physical Exam:   Dilation: 1.5 Effacement (%): 50 Station: -2 Exam by:: PHuston Foley RN Blood pressure 131/68, pulse 92, temperature 98.2 F (36.8 C), temperature source Oral, resp. rate 18, height 5' 4"$  (1.626 m), weight 91.2 kg, SpO2 100 %. A&O x 3 HEENT: Normal Lungs: CTAB CV: RRR Abdominal: Soft, Non-tender, Gravid, and Estimated fetal weight: 7 1/2 - 8 lbs  Lower Extremities: Non-edematous, Non-tender  Pelvic  Exam:      Per nurse, deferred  FHT: 130s, nml variability, +accels, no decels TOCO: irregular  Prenatal Transfer Tool  Maternal Diabetes: No Genetic Screening: Normal Maternal Ultrasounds/Referrals: Normal Fetal Ultrasounds or other Referrals:  None Maternal Substance Abuse:  No Significant Maternal Medications:  None Significant Maternal Lab Results: Group B Strep negative Number of Prenatal Visits:greater than 3 verified prenatal visits Other Comments:  None  Results for orders placed or performed during the hospital encounter of 05/23/22 (from the past 24 hour(s))  Fern Test     Status: Normal   Collection Time: 05/23/22  8:52 PM  Result Value Ref Range   POCT Fern Test Positive = ruptured amniotic membanes   CBC     Status: Abnormal   Collection Time: 05/23/22  8:57 PM  Result Value Ref Range   WBC 12.8 (H) 4.0 - 10.5 K/uL   RBC 3.94 3.87 - 5.11 MIL/uL   Hemoglobin 12.1 12.0 - 15.0 g/dL   HCT 35.5 (L) 36.0 - 46.0 %   MCV 90.1 80.0 - 100.0 fL   MCH 30.7 26.0 - 34.0 pg   MCHC 34.1 30.0 - 36.0 g/dL   RDW 12.3 11.5 - 15.5 %   Platelets 260 150 - 400 K/uL   nRBC 0.0 0.0 - 0.2 %  Type and screen     Status: None (Preliminary result)   Collection Time: 05/23/22  8:57 PM  Result Value Ref Range   ABO/RH(D) PENDING    Antibody Screen PENDING    Sample Expiration      05/26/2022,2359 Performed at [facility], [place] [address], [place], [place] [address]       Assessment/Plan:  27 y.o. G1P0 at 35w4dgestation   SROM - will admit for delivery; plan to augment with cytotec (buccal) and pitocin as needed;  Gbs neg Rh ned, s/p rhogam RI  5. Increased weight gain; efw 39% 6. Varicella NI 7. BMI >30   SCharyl Bigger2/21/2024, 9:10 PM

## 2022-05-23 NOTE — Anesthesia Procedure Notes (Signed)
Epidural Patient location during procedure: OB Start time: 05/23/2022 10:42 PM End time: 05/23/2022 10:45 PM  Staffing Anesthesiologist: Janeece Riggers, MD  Preanesthetic Checklist Completed: patient identified, IV checked, site marked, risks and benefits discussed, surgical consent, monitors and equipment checked, pre-op evaluation and timeout performed  Epidural Patient position: sitting Prep: DuraPrep and site prepped and draped Patient monitoring: continuous pulse ox and blood pressure Approach: midline Location: L3-L4 Injection technique: LOR air  Needle:  Needle type: Tuohy  Needle gauge: 17 G Needle length: 9 cm and 9 Needle insertion depth: 6 cm Catheter type: closed end flexible Catheter size: 19 Gauge Catheter at skin depth: 12 cm Test dose: negative  Assessment Events: blood not aspirated, no cerebrospinal fluid, injection not painful, no injection resistance, no paresthesia and negative IV test

## 2022-05-24 ENCOUNTER — Encounter (HOSPITAL_COMMUNITY): Payer: Self-pay | Admitting: Obstetrics

## 2022-05-24 LAB — CBC
HCT: 29.1 % — ABNORMAL LOW (ref 36.0–46.0)
Hemoglobin: 10.1 g/dL — ABNORMAL LOW (ref 12.0–15.0)
MCH: 31.5 pg (ref 26.0–34.0)
MCHC: 34.7 g/dL (ref 30.0–36.0)
MCV: 90.7 fL (ref 80.0–100.0)
Platelets: 249 10*3/uL (ref 150–400)
RBC: 3.21 MIL/uL — ABNORMAL LOW (ref 3.87–5.11)
RDW: 12.6 % (ref 11.5–15.5)
WBC: 18.6 10*3/uL — ABNORMAL HIGH (ref 4.0–10.5)
nRBC: 0 % (ref 0.0–0.2)

## 2022-05-24 LAB — RPR: RPR Ser Ql: NONREACTIVE

## 2022-05-24 MED ORDER — COCONUT OIL OIL: 1.0000 | TOPICAL_OIL | Status: DC | PRN

## 2022-05-24 MED ORDER — SIMETHICONE 80 MG PO CHEW
80.0000 mg | CHEWABLE_TABLET | ORAL | Status: DC | PRN
  Administered 2022-05-24: 80 mg via ORAL
  Filled 2022-05-24: qty 1

## 2022-05-24 MED ORDER — SENNOSIDES-DOCUSATE SODIUM 8.6-50 MG PO TABS: 2.0000 | ORAL_TABLET | Freq: Every day | ORAL | Status: DC

## 2022-05-24 MED ORDER — WITCH HAZEL-GLYCERIN EX PADS: 1.0000 | MEDICATED_PAD | CUTANEOUS | Status: DC | PRN

## 2022-05-24 MED ORDER — PRENATAL MULTIVITAMIN CH
1.0000 | ORAL_TABLET | Freq: Every day | ORAL | Status: DC
  Administered 2022-05-24 – 2022-05-25 (×2): 1 via ORAL
  Filled 2022-05-24 (×2): qty 1

## 2022-05-24 MED ORDER — IBUPROFEN 600 MG PO TABS
600.0000 mg | ORAL_TABLET | Freq: Four times a day (QID) | ORAL | Status: DC
  Administered 2022-05-24 – 2022-05-25 (×7): 600 mg via ORAL
  Filled 2022-05-24 (×7): qty 1

## 2022-05-24 MED ORDER — DIPHENHYDRAMINE HCL 25 MG PO CAPS: 25.0000 mg | ORAL_CAPSULE | Freq: Four times a day (QID) | ORAL | Status: DC | PRN

## 2022-05-24 MED ORDER — DIBUCAINE (PERIANAL) 1 % EX OINT: 1.0000 | TOPICAL_OINTMENT | CUTANEOUS | Status: DC | PRN

## 2022-05-24 MED ORDER — TETANUS-DIPHTH-ACELL PERTUSSIS 5-2.5-18.5 LF-MCG/0.5 IM SUSY: 0.5000 mL | PREFILLED_SYRINGE | Freq: Once | INTRAMUSCULAR | Status: DC

## 2022-05-24 MED ORDER — ONDANSETRON HCL 4 MG/2ML IJ SOLN: 4.0000 mg | INTRAMUSCULAR | Status: DC | PRN

## 2022-05-24 MED ORDER — ACETAMINOPHEN 325 MG PO TABS: 650.0000 mg | ORAL_TABLET | ORAL | Status: DC | PRN

## 2022-05-24 MED ORDER — SENNOSIDES-DOCUSATE SODIUM 8.6-50 MG PO TABS
2.0000 | ORAL_TABLET | Freq: Every day | ORAL | Status: DC
  Administered 2022-05-24 – 2022-05-25 (×2): 2 via ORAL
  Filled 2022-05-24 (×2): qty 2

## 2022-05-24 MED ORDER — BENZOCAINE-MENTHOL 20-0.5 % EX AERO
1.0000 | INHALATION_SPRAY | CUTANEOUS | Status: DC | PRN
  Administered 2022-05-24: 1 via TOPICAL
  Filled 2022-05-24: qty 56

## 2022-05-24 MED ORDER — ONDANSETRON HCL 4 MG PO TABS: 4.0000 mg | ORAL_TABLET | ORAL | Status: DC | PRN

## 2022-05-24 NOTE — Progress Notes (Signed)
0500 reassessment of headache pain, now states "maybe a 1, it is tolerable"

## 2022-05-24 NOTE — Lactation Note (Signed)
This note was copied from a baby's chart. Lactation Consultation Note  Patient Name: Boy Ulonda Polin [id number] Date: 05/24/2022   Age:29 hours P1, 40.5 GA  Baby is skin to skin with mother and sleeping. Mother's nurse has assisted her with latching, however, "Eulas Post" is has been sleepy and uninterested. RN helped mother with hand expression and spoon feeding/ finger feeding.   [place] [facility] handout with services and support following discharge given to mother. Instructed mother to call for assistance when baby shows feeding cues. Continue skin to skin and hand expression.    Consult Status  Follow up 05/25/22 In-patient    Gwenevere Abbot 05/24/2022, 3:44 PM

## 2022-05-24 NOTE — Lactation Note (Signed)
This note was copied from a baby's chart. Lactation Consultation Note  Patient Name: Jade Curtis [id number] Date: 05/24/2022 Reason for consult: Difficult latch;Mother's request;Early term 37-38.6wks;1st time breastfeeding Age:29 hours, infant had two voids and one stool since birth.  Long LC  consult: Infant has not been latching well at the breast, Per Birth Parent, infant  would latch few seconds then stop will not sustain his latch, Birth Parent been giving infant 2 mls of colostrum from hand expression and infant has slept most of the day. LC entered the room at [facility] request, infant very fussy and inconsolable. LC ask Birth Parent to hand express and infant was given 4 mls of EBM appeared calmer. Birth Parent initally latched infant using the cradle hold on her right breast, infant would not sustain latch, Birth Parent tried multiple times  but infant  was still  cuing to fed. LC ask Birth Parent express small amount of colostrum and applied 20 mm NS, infant re-latch and sustained latch initially BF for 4 minutes without NS but on and off the breast,  and then 10 minutes with 20 mm NS which he sustained latch throughout the feeding. [place] set Birth Parent up with DEBP due to using NS, Birth Parent was pumping as LC left the room. Birth Parent understands that NS is for temporary use not long tern to help infant sustain latch and learn how to breast feed.  When infant came off the breast their was EBM in NS. Birth Parent will try to latch infant in morning without NS.  LC did  discuss donor breast milk if infant continue to not sustain latch Birth Parent will think about it and let RN know their choice. Birth Parent understands that infant feeding plan may change and that [place] at next visit may have a different feeding plan for infant.   Current feeding plan: 1- Birth Parent will pre-pump breast with hand pump and apply 20 mm NS, continue to breastfeed infant by cues and on demand, 8 to 12+  times within 24 hours, STS and continue to ask RN/LC for latch assistance or help with apply 20 mm NS. 2- Birth Parent will continue to use DEBP every 3 hours for 15 minutes on initial setting and knows EBM is safe at room temperature for 4 hours, if expressing colostrum when pumping will offer to infant after latching infant at the breast.  Maternal Data    Feeding Mother's Current Feeding Choice: Breast Milk  LATCH Score Latch: Repeated attempts needed to sustain latch, nipple held in mouth throughout feeding, stimulation needed to elicit sucking reflex.  Audible Swallowing: A few with stimulation  Type of Nipple: Everted at rest and after stimulation (short shafted will pre-pump prior to latching infant.)  Comfort (Breast/Nipple): Soft / non-tender  Hold (Positioning): Assistance needed to correctly position infant at breast and maintain latch.  LATCH Score: 7   Lactation Tools Discussed/Used Tools: Pump;Flanges Flange Size: 21 Breast pump type: Double-Electric Breast Pump Pump Education: Setup, frequency, and cleaning;Milk Storage Reason for Pumping: Infant is poor feeder , hx being sleep and only latchin at breast for seconds not minutes, Birth Parent given 20 mm NS Pumping frequency: Birth Parent will continue to pump every 3 hours for 15 minutes on inital setting.  Interventions Interventions: Support pillows;Adjust position;Position options;Skin to skin;Assisted with latch;Pre-pump if needed;Breast compression;Hand pump;Education;DEBP  Discharge    Consult Status Consult Status: Follow-up Date: 05/25/22 Follow-up type: In-patient    Eulis Canner 05/24/2022,  11:18 PM

## 2022-05-24 NOTE — Anesthesia Postprocedure Evaluation (Signed)
Anesthesia Post Note  Patient: Jade Curtis  Procedure(s) Performed: AN AD [place]     Patient location during evaluation: Mother Baby Anesthesia Type: Epidural Level of consciousness: awake and alert and oriented Pain management: satisfactory to patient Vital Signs Assessment: post-procedure vital signs reviewed and stable Respiratory status: respiratory function stable Cardiovascular status: stable Postop Assessment: no headache, no backache, epidural receding, patient able to bend at knees, no signs of nausea or vomiting, adequate PO intake and able to ambulate Anesthetic complications: no   No notable events documented.  Last Vitals:  Vitals:   05/24/22 1000 05/24/22 1330  BP: 114/71 115/61  Pulse: 80 75  Resp: 18 18  Temp: 36.8 C 37.1 C  SpO2: 100% 100%    Last Pain:  Vitals:   05/24/22 1330  TempSrc: Oral  PainSc: 0-No pain   Pain Goal:                   Jacques Fife

## 2022-05-25 ENCOUNTER — Inpatient Hospital Stay (HOSPITAL_COMMUNITY): Payer: BC Managed Care – PPO

## 2022-05-25 ENCOUNTER — Other Ambulatory Visit: Payer: Self-pay

## 2022-05-25 ENCOUNTER — Inpatient Hospital Stay (HOSPITAL_COMMUNITY): Admission: RE | Admit: 2022-05-25 | Payer: BC Managed Care – PPO | Source: Home / Self Care | Admitting: Obstetrics

## 2022-05-25 MED ORDER — IBUPROFEN 600 MG PO TABS: 600.0000 mg | ORAL_TABLET | Freq: Four times a day (QID) | ORAL | 0 refills | Status: AC

## 2022-05-25 MED ORDER — ACETAMINOPHEN 325 MG PO TABS: 650.0000 mg | ORAL_TABLET | ORAL | 1 refills | Status: AC | PRN

## 2022-05-25 NOTE — Lactation Note (Signed)
This note was copied from a baby's chart. Lactation Consultation Note  Patient Name: Jade Curtis [id number] Date: 05/25/2022 Reason for consult: Follow-up assessment;Difficult latch;Term;Breastfeeding assistance;Infant weight loss (6.09% WL) Age:29 hours  LC entered the room and the infant was asleep in the bassinet.  Per the parents, the infant did well at his last feeding.  The birth parent stated that she feels better about feeding than she did this morning.  LC reviewed engorgement, mastitis, breast care, warning signs, infant I/O, and outpatient services.  The birth parent said that she had no further questions or concerns.   Infant Feeding Plan:  Breastfeed 8+ times in 24 hours according to feeding cues.  Put the infant to the breast for 5-10 min before bottle feeding according to the plan laid out by the speech pathologist.  Pump after feedings and feed the expressed milk to the infant via a bottle.  Watch infant output and call the pediatrician with concerns.  Call the outpatient lactation consultant for assistance with breastfeeding.    Interventions Interventions: Education  Discharge Discharge Education: Engorgement and breast care;Warning signs for feeding baby;Outpatient recommendation  Consult Status Consult Status: Complete Date: 05/25/22 Follow-up type: Call as needed    Lysbeth Penner 05/25/2022, 4:42 PM

## 2022-05-25 NOTE — Lactation Note (Addendum)
This note was copied from a baby's chart. Lactation Consultation Note  Patient Name: Jade Curtis [id number] Date: 05/25/2022 Reason for consult: Follow-up assessment;Difficult latch;[place];1st time breastfeeding;Term;Breastfeeding assistance;Infant weight loss (6.09% WL) Age:29 hours  LC entered the room and the birth parent was preparing to bottle feed the infant 55m of her expressed breast milk.  [place] if the birth parent would like assistance with putting the infant to the breast.  The birth parent was receptive.  LC assisted the birth parent with attempting to put the infant to the right breast in the football position.  The infant held the nipple in his mouth and would only do a few sucks before coming off the breast.  The infant had an episode of emesis and the LC changed a stool while in the room.  The parents said that the infant has been having problems with bottle feeding.  LC fed the infant 71mof expressed milk after doing some suck training with the purple nipple.  LC then put the infant on both breasts in the cross-cradle position without the size #16 NS.  The infant latched deeply with his tongue down, lips were flanged, and sucking was rhythmic with stimulation.  LC spoke with the birth parent about positioning and sandwiching the breast tissue.  LCJoyceducated the birth parent on engorgement, mastitis, breast care, infant I/O, and outpatient services.  The infant fed for 30 min while the LC was in the room.  The infant was still feeding when LCApisoneft the room.  The birth parent will call for assistance at the next feeding.   Feeding Nipple Type: Extra Slow Flow  LATCH Score Latch: Repeated attempts needed to sustain latch, nipple held in mouth throughout feeding, stimulation needed to elicit sucking reflex.  Audible Swallowing: Spontaneous and intermittent  Type of Nipple: Flat  Comfort (Breast/Nipple): Soft / non-tender  Hold (Positioning): Assistance  needed to correctly position infant at breast and maintain latch.  LATCH Score: 7   Lactation Tools Discussed/Used Tools: Nipple Shields Nipple shield size: 16  Interventions Interventions: Assisted with latch;Breast massage;Breast compression;Adjust position;Support pillows;Education  Discharge Discharge Education: Engorgement and breast care;Warning signs for feeding baby;Outpatient recommendation  Consult Status Consult Status: Follow-up Date: 05/25/22 Follow-up type: In-patient    SiElly Modenaizzell 05/25/2022, 10:01 AM

## 2022-05-25 NOTE — Discharge Summary (Signed)
OB Discharge Summary  Patient Name: Jade Curtis DOB: [birth year] MRN: [id number]  Date of admission: 05/23/2022 Delivering MD: Tiana Loft E   Date of discharge: 05/25/2022  Admitting diagnosis: Personal history of previous postdates pregnancy [Z87.59] Intrauterine pregnancy: [redacted]w[redacted]d    Secondary diagnosis:Principal Problem:   Postpartum care following vaginal delivery 2/22 Active Problems:   Personal history of previous postdates pregnancy   SVD (spontaneous vaginal delivery)   right labial; 2nd degree perineal  Additional problems:none     Discharge diagnosis: Term Pregnancy Delivered                                                                     Post partum procedures: none  Augmentation:  none  Complications: None  Hospital course:  Onset of Labor With Vaginal Delivery      27y.o. yo G1P1001 at 459w5das admitted in Active Labor on 05/23/2022. Labor course was complicated bynothing   Membrane Rupture Time/Date: 8:30 PM ,05/23/2022   Delivery Method:Vaginal, Spontaneous  Episiotomy: None  Lacerations:  2nd degree  Patient had a postpartum course complicated by nothing.  She is ambulating, tolerating a regular diet, passing flatus, and urinating well. Patient is discharged home in stable condition on 05/25/22.  Newborn Data: Birth date:05/24/2022  Birth time:2:25 AM  Gender:Female  Living status:Living  Apgars:8 ,9  Weight:3120 g   Physical exam  Vitals:   05/24/22 1700 05/24/22 2117 05/25/22 0640 05/25/22 1429  BP: 118/74 117/72 118/69 117/73  Pulse: 77 74 95 84  Resp: '16 16 17 18  '$ Temp: 98.4 F (36.9 C) 97.7 F (36.5 C) 97.6 F (36.4 C) 98.2 F (36.8 C)  TempSrc: Oral Oral Oral Oral  SpO2: 100% 99% 100% 100%  Weight:      Height:       General: alert, cooperative, and no distress Lochia: appropriate Uterine Fundus: firm Incision: N/A DVT Evaluation: No evidence of DVT seen on physical exam. Labs: Lab Results  Component Value Date    WBC 18.6 (H) 05/24/2022   HGB 10.1 (L) 05/24/2022   HCT 29.1 (L) 05/24/2022   MCV 90.7 05/24/2022   PLT 249 05/24/2022      Latest Ref Rng & Units 02/28/2013   11:45 PM  CMP  Glucose 70 - 99 mg/dL 101   BUN 6 - 23 mg/dL 12   Creatinine 0.50 - 1.10 mg/dL 1.00   Sodium 135 - 145 mEq/L 142   Potassium 3.5 - 5.1 mEq/L 3.5   Chloride 96 - 112 mEq/L 107     Discharge instruction: per After Visit Summary and "Baby and Me Booklet".  After Visit Meds:  Allergies as of 05/25/2022       Reactions   Codeine    childhood           Medication List     TAKE these medications    acetaminophen 325 MG tablet Commonly known as: Tylenol Take 2 tablets (650 mg total) by mouth every 4 (four) hours as needed (for pain scale < 4).   ibuprofen 600 MG tablet Commonly known as: ADVIL Take 1 tablet (600 mg total) by mouth every 6 (six) hours.   MAGNESIUM PO Take by mouth.  metoCLOPramide 10 MG tablet Commonly known as: Reglan Take 1 tablet (10 mg total) by mouth 4 (four) times daily. As needed for migraine.   PRENATAL VITAMINS PO Take by mouth.   UNISOM PO Take by mouth at bedtime as needed.        Diet: routine diet  Activity: Advance as tolerated. Pelvic rest for 6 weeks.   Outpatient follow up:6 weeks Follow up Appt: Future Appointments  Date Time Provider [place]  06/13/2022  2:00 PM Melvenia Beam, MD [facility] None   Follow up visit: No follow-ups on file.  Postpartum contraception: Not Discussed  Newborn Data: Live born female  Birth Weight: 6 lb 14.1 oz (3120 g) APGAR: 8, 9  Newborn Delivery   Birth date/time: 05/24/2022 02:25:00 Delivery type: Vaginal, Spontaneous      Baby Feeding: Breast Disposition:home with mother   05/25/2022 Ala Dach, MD

## 2022-05-25 NOTE — Progress Notes (Signed)
Post Partum Day 1, G1P1, labor admit, SVD. Boy.   Subjective: no complaints, up ad lib, voiding, tolerating PO, and + flatus  Objective: Blood pressure 118/69, pulse 95, temperature 97.6 F (36.4 C), temperature source Oral, resp. rate 17, height '5\' 4"'$  (1.626 m), weight 91.2 kg, SpO2 100 %, unknown if currently breastfeeding.  Physical Exam:  General: alert and cooperative Lochia: appropriate Uterine Fundus: firm Incision: healing well DVT Evaluation: No evidence of DVT seen on physical exam. Negative Homan's sign.     Latest Ref Rng & Units 05/24/2022    5:51 AM 05/23/2022    8:57 PM   CBC  WBC 4.0 - 10.5 K/uL 18.6  12.8    Hemoglobin 12.0 - 15.0 g/dL 10.1  12.1    Hematocrit 36.0 - 46.0 % 29.1  35.5    Platelets 150 - 400 K/uL 249  260      A Neg. Baby also A Neg.  No Rhogam indicated Rub Imm   Assessment/Plan: PPD #1, SVD. Boy Routine PP care Rh neg- baby also Rh neg   Baby Boy- breast feeding. Circumcision dw pt, consents but on HOLD until next feed assessment. Nursery will call Dr Pamala Hurry after 1 pm if baby cleared.  Early discharge desired if baby okay, has outpt Peds visit tomorrow    LOS: 2 days   Elveria Royals, MD 05/25/2022, 11:53 AM

## 2022-06-02 ENCOUNTER — Telehealth (HOSPITAL_COMMUNITY): Payer: Self-pay | Admitting: *Deleted

## 2022-06-02 NOTE — Telephone Encounter (Signed)
Patient voiced no questions or concerns regarding her health at this time. EPDS=0. Patient stated infant makes a "high-pitch squeaky noise" while nursing. Denied any signs of infant distress with feeds. RN encouraged patient to make an appointment with pediatrician or with the lactation consultant in their office for evaluation to reassure patient. Patient verbalized understanding. Patient voiced no other questions or concerns regarding infant at this time. Patient reports infant sleeps in a bassinet on his back. RN reviewed ABCs of safe sleep. Patient verbalized understanding. Patient requested RN email information on hospital's virtual postpartum classes and support groups. Email sent. Erline Levine, RN, 06/02/22, 1055

## 2022-06-13 ENCOUNTER — Ambulatory Visit (INDEPENDENT_AMBULATORY_CARE_PROVIDER_SITE_OTHER): Payer: Self-pay | Admitting: Neurology

## 2022-06-13 DIAGNOSIS — G43709 Chronic migraine without aura, not intractable, without status migrainosus: Secondary | ICD-10-CM

## 2022-06-13 MED ORDER — RIZATRIPTAN BENZOATE 10 MG PO TBDP: 10.0000 mg | ORAL_TABLET | ORAL | 11 refills | Status: DC | PRN

## 2022-06-13 NOTE — Progress Notes (Signed)
[facility]    Provider:  Dr Jaynee Eagles Requesting Provider: No ref. provider found Primary Care Provider:  Patient, [name]  CC:  Migraines  01/01/2022: She found out the middle of June she was pregnant. The first 2 weeks of July it was very bad but since then she has not really had a migraine.  HPI 06/13/2020:  Jade Curtis is a 27 y.o. female here as requested by No ref. provider found for migraines. Since a child. They were wors ein elementary school, vomiting, went to many urgent cares as a child. Over the years she ahs learned how to handle it better. She tries to drink water. She had to take a maxalt every day last week, weather triggered more migraines, also her period can trigger a migraine. Starts unilaterally, pulsating/pounding/throbbing, osmophobia, nausea, can last 24 hours, a strong scent can make it worse, she used to vomit. Over the last 6 months, she has 15 headache days a month and 8 migraine days a month that are moderately severe to severe, no aura, no medication overuse. She is not planning on children anytime soon, however she is no on birth control and so this may limit our choice of medications. No other focal neurologic deficits, associated symptoms, inciting events or modifiable factors.No vision changes. Not positional. No new symptoms, quality stable.   She has tried Topamax for several years with side effects, Depakote for several years(gained excessive weight), Zomig, Maxalt (still takes and it works well for her), Imitrex nasal spray, Ibuprofen, Tylenol, Excedrin migraine, Aleve, Tylenol cold and sinus, Sudafed, amitriptyline(excessive fatigue), diclofenac, flexeril, verapamil(side effect hypotension), propranolol (side effect hypotension), tried Ajovy, reglan, topiramate, nurtec, ubrelvy  Reviewed notes, labs and imaging from outside physicians, which showed: see above  Review of Systems: Patient complains of symptoms per HPI as well as the  following symptoms: headache. Pertinent negatives and positives per HPI. All others negative.   Social History   Socioeconomic History   Marital status: Married    Spouse name: Not on file   Number of children: Not on file   Years of education: Not on file   Highest education level: Not on file  Occupational History   Not on file  Tobacco Use   Smoking status: Never   Smokeless tobacco: Never  Vaping Use   Vaping Use: Never used  Substance and Sexual Activity   Alcohol use: Not Currently    Comment: occasionally    Drug use: Not Currently    Types: Marijuana   Sexual activity: Not Currently  Other Topics Concern   Not on file  Social History Narrative   Lives with her husband    Right handed   Caffeine: coffee and sometimes diet coke in afternoon   Social Determinants of Health   Financial Resource Strain: Not on file  Food Insecurity: No Food Insecurity (05/23/2022)   Hunger Vital Sign    Worried About Running Out of Food in the Last Year: Never true    Ran Out of Food in the Last Year: Never true  Transportation Needs: No Transportation Needs (05/23/2022)   PRAPARE - Hydrologist (Medical): No    Lack of Transportation (Non-Medical): No  Physical Activity: Not on file  Stress: Not on file  Social Connections: Not on file  Intimate Partner Violence: Not At Risk (05/24/2022)   Humiliation, Afraid, Rape, and Kick questionnaire    Fear of Current or Ex-Partner: No    Emotionally  Abused: No    Physically Abused: No    Sexually Abused: No    Family History  Problem Relation Age of Onset   Migraines Mother    Kidney disease Father    Migraines Maternal Grandmother     Past Medical History:  Diagnosis Date   ADHD (attention deficit hyperactivity disorder)    Migraines     Patient Active Problem List   Diagnosis Date Noted   Postpartum care following vaginal delivery 2/22 05/24/2022   SVD (spontaneous vaginal delivery) 05/24/2022    right labial; 2nd degree perineal 05/24/2022   Personal history of previous postdates pregnancy 05/23/2022   Chronic migraine without aura without status migrainosus, not intractable 06/13/2020   Midline thoracic back pain 10/13/2013    Past Surgical History:  Procedure Laterality Date   NO PAST SURGERIES     TONSILLECTOMY      Current Outpatient Medications  Medication Sig Dispense Refill   acetaminophen (TYLENOL) 325 MG tablet Take 2 tablets (650 mg total) by mouth every 4 (four) hours as needed (for pain scale < 4). 60 tablet 1   Doxylamine Succinate, Sleep, (UNISOM PO) Take by mouth at bedtime as needed.     ibuprofen (ADVIL) 600 MG tablet Take 1 tablet (600 mg total) by mouth every 6 (six) hours. 30 tablet 0   MAGNESIUM PO Take by mouth.     metoCLOPramide (REGLAN) 10 MG tablet Take 1 tablet (10 mg total) by mouth 4 (four) times daily. As needed for migraine. 30 tablet 3   Prenatal Vit-Fe Fumarate-FA (PRENATAL VITAMINS PO) Take by mouth.     No current facility-administered medications for this visit.    Allergies as of 06/13/2022 - Review Complete 05/23/2022  Allergen Reaction Noted   Codeine  02/28/2013    Vitals: There were no vitals taken for this visit. Last Weight:  Wt Readings from Last 1 Encounters:  05/23/22 201 lb (91.2 kg)   Last Height:   Ht Readings from Last 1 Encounters:  05/23/22 '5\' 4"'$  (1.626 m)    Exam: NAD, pleasant                  Speech:    Speech is normal; fluent and spontaneous with normal comprehension.  Cognition:    The patient is oriented to person, place, and time;     recent and remote memory intact;     language fluent;    Cranial Nerves:    The pupils are equal, round, and reactive to light.Trigeminal sensation is intact and the muscles of mastication are normal. The face is symmetric. The palate elevates in the midline. Hearing intact. Voice is normal. Shoulder shrug is normal. The tongue has normal motion without  fasciculations.   Coordination:  No dysmetria  Motor Observation:    No asymmetry, no atrophy, and no involuntary movements noted. Tone:    Normal muscle tone.     Strength:    Strength is V/V in the upper and lower limbs.      Sensation: intact to LT    Assessment/Plan:  27 year old with chronic migraines failed multiple medications now [redacted] weeks pregnant. We discussed risks in pregnancy, since she is not having many headaches/migraines now (80% of woman in pregnancy have great 2nd and 3rd trimesters) we can give reglan, can also try cyproheptadine or sumatriptan in the future we discussed medication classes and risks.   She has tried Topamax for several years with side effects, Depakote for several years(gained  excessive weight), Zomig, Maxalt (still takes and it works well for her), Imitrex nasal spray, Ibuprofen, Tylenol, Excedrin migraine, Aleve, Tylenol cold and sinus, Sudafed, amitriptyline(excessive fatigue), diclofenac, flexeril, verapamil(side effect hypotension), propranolol (side effect hypotension), tried Ajovy, reglan, topiramate, nurtec(didn't feel it helpes), ubrelvy(she would try again)  In the future we will see her in mid march, if having migraines again she would be interested in botox, we can always try to provide samples while we get it approved  There are limited treatments in pregnancy and all have risks. Tylenol is generally accepted as safe in pregnancy although there has even been evidence for risks with tylenol. Discussed.  Other interventions to try and decreased the use of medication for headache/migraine include:  Cool Compress. Lie down and place a cool compress on your head.  Avoid headache triggers. If certain foods or odors seem to have triggered your migraines in the past, avoid them. A headache diary might help you identify triggers.  Include physical activity in your daily routine. Try a daily walk or other moderate aerobic exercise.  Manage stress.  Find healthy ways to cope with the stressors, such as delegating tasks on your to-do list.  Practice relaxation techniques. Try deep breathing, yoga, massage and visualization.  Eat regularly. Eating regularly scheduled meals and maintaining a healthy diet might help prevent headaches. Also, drink plenty of fluids.  Follow a regular sleep schedule. Sleep deprivation might contribute to headaches  Consider biofeedback. With this mind-body technique, you learn to control certain bodily functions -- such as muscle tension, heart rate and blood pressure -- to prevent headaches or reduce headache pain.  Headaches during pregnancy are common. However, if you develop a severe headache or a headache that doesn't go away, call your health care provider. Severe headaches can be a sign of a pregnancy complication    No orders of the defined types were placed in this encounter.   Cc: No ref. provider found,  Patient, [name] Sarina Ill, MD  [facility] [address] [place], [place] [address]  I spent 30 minutes of face-to-face and non-face-to-face time with patient on the  No diagnosis found.  diagnosis.  This included previsit chart review, lab review, study review, order entry, electronic health record documentation, patient education on the different diagnostic and therapeutic options, counseling and coordination of care, risks and benefits of management, compliance, or risk factor reduction  Phone [phone] Fax [phone]

## 2022-06-14 ENCOUNTER — Ambulatory Visit: Payer: BC Managed Care – PPO | Admitting: Neurology

## 2022-07-23 ENCOUNTER — Ambulatory Visit: Payer: BC Managed Care – PPO | Admitting: Neurology

## 2022-07-23 ENCOUNTER — Encounter: Payer: Self-pay | Admitting: Neurology

## 2022-07-23 VITALS — BP 121/77 | HR 74 | Ht 64.0 in | Wt 173.0 lb

## 2022-07-23 DIAGNOSIS — G43709 Chronic migraine without aura, not intractable, without status migrainosus: Secondary | ICD-10-CM | POA: Diagnosis not present

## 2022-07-23 MED ORDER — FREMANEZUMAB-VFRM 225 MG/1.5ML ~~LOC~~ SOSY: 225.0000 mg | PREFILLED_SYRINGE | Freq: Once | SUBCUTANEOUS | Status: DC

## 2022-07-23 MED ORDER — UBRELVY 100 MG PO TABS: 100.0000 mg | ORAL_TABLET | ORAL | 0 refills | Status: DC | PRN

## 2022-07-23 NOTE — Progress Notes (Signed)
[facility]    Provider:  Dr Lucia Gaskins Requesting Provider: No ref. provider found Primary Care Provider:  Patient, [name]  CC:  Migraines  07/23/2022: She is getting migraines now since giving birth. Did great during pregnancy but now having >8 migraines a month and > 15 total headache days a month.Maxalt worked acutely.  We discussed multiple options since patient is lactating, I showed her NIH lactmed med, I printed off documentation on every medication that she is taking, we talked about the risks, nothing is safe in lactation or pregnancy, but some are thought to be more safe than others, Ajovy and Bernita Raisin are mostly protein bound unlikely to affect the infant but I did print her out all the information found in NIH lactation medication database on all these medications.  We discussed in detail.  Decided to start Ajovy.  She could not get pregnant for 6 months after starting Ajovy.  We discussed teratogenicity.  Also gave her Ubrelvy samples.  She can use rizatriptan in the meantime.  Patient complains of symptoms per HPI as well as the following symptoms: migraine during lactation . Pertinent negatives and positives per HPI. All others negative   01/01/2022: She found out the middle of June she was pregnant. The first 2 weeks of July it was very bad but since then she has not really had a migraine.  Patient complains of symptoms per HPI as well as the following symptoms: migraines . Pertinent negatives and positives per HPI. All others negative   HPI 06/13/2020:  Jade Curtis is a 27 y.o. female here as requested by No ref. provider found for migraines. Since a child. They were wors ein elementary school, vomiting, went to many urgent cares as a child. Over the years she ahs learned how to handle it better. She tries to drink water. She had to take a maxalt every day last week, weather triggered more migraines, also her period can trigger a migraine. Starts  unilaterally, pulsating/pounding/throbbing, osmophobia, nausea, can last 24 hours, a strong scent can make it worse, she used to vomit. Over the last 6 months, she has 15 headache days a month and 8 migraine days a month that are moderately severe to severe, no aura, no medication overuse. She is not planning on children anytime soon, however she is no on birth control and so this may limit our choice of medications. No other focal neurologic deficits, associated symptoms, inciting events or modifiable factors.No vision changes. Not positional. No new symptoms, quality stable.   She has tried Topamax for several years with side effects, Depakote for several years(gained excessive weight), Zomig, Maxalt (still takes and it works well for her), Imitrex nasal spray, Ibuprofen, Tylenol, Excedrin migraine, Aleve, Tylenol cold and sinus, Sudafed, amitriptyline(excessive fatigue), diclofenac, flexeril, verapamil(side effect hypotension), propranolol (side effect hypotension), tried Ajovy, reglan, topiramate, nurtec, ubrelvy, aimovig is contraindicated due to constipation  Reviewed notes, labs and imaging from outside physicians, which showed: see above  Review of Systems: Patient complains of symptoms per HPI as well as the following symptoms: headache. Pertinent negatives and positives per HPI. All others negative.   Social History   Socioeconomic History   Marital status: Married    Spouse name: Not on file   Number of children: Not on file   Years of education: Not on file   Highest education level: Not on file  Occupational History   Not on file  Tobacco Use   Smoking status: Never  Smokeless tobacco: Never  Vaping Use   Vaping Use: Never used  Substance and Sexual Activity   Alcohol use: Not Currently    Comment: occasionally    Drug use: Not Currently    Types: Marijuana   Sexual activity: Not Currently  Other Topics Concern   Not on file  Social History Narrative   Lives with her  husband    Right handed   Caffeine: coffee and sometimes diet coke in afternoon   Social Determinants of Health   Financial Resource Strain: Not on file  Food Insecurity: No Food Insecurity (05/23/2022)   Hunger Vital Sign    Worried About Running Out of Food in the Last Year: Never true    Ran Out of Food in the Last Year: Never true  Transportation Needs: No Transportation Needs (05/23/2022)   PRAPARE - Administrator, Civil Service (Medical): No    Lack of Transportation (Non-Medical): No  Physical Activity: Not on file  Stress: Not on file  Social Connections: Not on file  Intimate Partner Violence: Not At Risk (05/24/2022)   Humiliation, Afraid, Rape, and Kick questionnaire    Fear of Current or Ex-Partner: No    Emotionally Abused: No    Physically Abused: No    Sexually Abused: No    Family History  Problem Relation Age of Onset   Migraines Mother    Kidney disease Father    Migraines Maternal Grandmother     Past Medical History:  Diagnosis Date   ADHD (attention deficit hyperactivity disorder)    Migraines     Patient Active Problem List   Diagnosis Date Noted   Postpartum care following vaginal delivery 2/22 05/24/2022   SVD (spontaneous vaginal delivery) 05/24/2022   right labial; 2nd degree perineal 05/24/2022   Personal history of previous postdates pregnancy 05/23/2022   Chronic migraine without aura without status migrainosus, not intractable 06/13/2020   Midline thoracic back pain 10/13/2013    Past Surgical History:  Procedure Laterality Date   NO PAST SURGERIES     TONSILLECTOMY      Current Outpatient Medications  Medication Sig Dispense Refill   acetaminophen (TYLENOL) 325 MG tablet Take 2 tablets (650 mg total) by mouth every 4 (four) hours as needed (for pain scale < 4). 60 tablet 1   amphetamine-dextroamphetamine (ADDERALL XR) 30 MG 24 hr capsule Take 30 mg by mouth daily.     amphetamine-dextroamphetamine (ADDERALL) 15 MG  tablet Take 1 tablet by mouth daily.     Doxylamine Succinate, Sleep, (UNISOM PO) Take by mouth at bedtime as needed.     ibuprofen (ADVIL) 600 MG tablet Take 1 tablet (600 mg total) by mouth every 6 (six) hours. 30 tablet 0   MAGNESIUM PO Take by mouth.     metoCLOPramide (REGLAN) 10 MG tablet Take 1 tablet (10 mg total) by mouth 4 (four) times daily. As needed for migraine. 30 tablet 3   Prenatal Vit-Fe Fumarate-FA (PRENATAL VITAMINS PO) Take by mouth.     rizatriptan (MAXALT-MLT) 10 MG disintegrating tablet Take 1 tablet (10 mg total) by mouth as needed for migraine. May repeat in 2 hours if needed 12 tablet 11   Ubrogepant (UBRELVY) 100 MG TABS Take 1 tablet (100 mg total) by mouth every 2 (two) hours as needed. Maximum  a day. 4 tablet 0   Current Facility-Administered Medications  Medication Dose Route Frequency Provider Last Rate Last Admin   Fremanezumab-vfrm SOSY 225 mg  225 mg  Subcutaneous Once Anson Fret, MD        Allergies as of 07/23/2022 - Review Complete 07/23/2022  Allergen Reaction Noted   Codeine  02/28/2013    Vitals: BP 121/77   Pulse 74   Ht 5\' 4"  (1.626 m)   Wt 173 lb (78.5 kg)   BMI 29.70 kg/m  Last Weight:  Wt Readings from Last 1 Encounters:  07/23/22 173 lb (78.5 kg)   Last Height:   Ht Readings from Last 1 Encounters:  07/23/22 5\' 4"  (1.626 m)   Physical exam: Exam: Gen: NAD, conversant, well nourised, obese, well groomed                     CV: RRR, no MRG. No Carotid Bruits. No peripheral edema, warm, nontender Eyes: Conjunctivae clear without exudates or hemorrhage  Neuro: Detailed Neurologic Exam  Speech:    Speech is normal; fluent and spontaneous with normal comprehension.  Cognition:    The patient is oriented to person, place, and time;     recent and remote memory intact;     language fluent;     normal attention, concentration,     fund of knowledge Cranial Nerves:    The pupils are equal, round, and reactive to  light. The fundi are normal and spontaneous venous pulsations are present. Visual fields are full to finger confrontation. Extraocular movements are intact. Trigeminal sensation is intact and the muscles of mastication are normal. The face is symmetric. The palate elevates in the midline. Hearing intact. Voice is normal. Shoulder shrug is normal. The tongue has normal motion without fasciculations.   Coordination:    Normal finger to nose and heel to shin. Normal rapid alternating movements.   Gait:    Heel-toe and tandem gait are normal.   Motor Observation:    No asymmetry, no atrophy, and no involuntary movements noted. Tone:    Normal muscle tone.    Posture:    Posture is normal. normal erect    Strength:    Strength is V/V in the upper and lower limbs.      Sensation: intact to LT     Reflex Exam:  DTR's:    Deep tendon reflexes in the upper and lower extremities are normal bilaterally.   Toes:    The toes are downgoing bilaterally.   Clonus:    Clonus is absent.  Assessment/Plan:  27 year old with chronic migraines failed multiple medications now lactating. We discussed risks in breasfeeding,   07/23/2022: She is getting migraines now since giving birth. Did great during pregnancy but now having >8 migraines a month and > 15 total headache days a month.Maxalt worked acutely.  We discussed multiple options since patient is lactating, I showed her NIH lactmed med, I printed off documentation on every medication that she is taking, we talked about the risks, nothing is safe in lactation or pregnancy, but some are thought to be more safe than others, Ajovy and Bernita Raisin are mostly protein bound unlikely to affect the infant but I did print her out all the information found in NIH lactation medication database on all these medications.  We discussed in detail.  Decided to start Ajovy.  She could not get pregnant for 6 months after starting Ajovy.  We discussed teratogenicity.  Also  gave her Ubrelvy samples.  She can use rizatriptan in the meantime.  She has tried Topamax for several years with side effects, Depakote for several years(gained excessive  weight), Zomig, Maxalt (still takes and it works well for her), Imitrex nasal spray, Ibuprofen, Tylenol, Excedrin migraine, Aleve, Tylenol cold and sinus, Sudafed, amitriptyline(excessive fatigue), diclofenac, flexeril, verapamil(side effect hypotension), propranolol (side effect hypotension), tried Ajovy, reglan, topiramate, nurtec(didn't feel it helpes), ubrelvy(she would try again), aimovig contraindicated due to constipation  In the future we will see her in 6 months  There are limited treatments in breastfeeding and all have risks. Tylenol is generally accepted as safe in pregnancy although there has even been evidence for risks with tylenol. Discussed.  Other interventions to try and decreased the use of medication for headache/migraine include:  Cool Compress. Lie down and place a cool compress on your head.  Avoid headache triggers. If certain foods or odors seem to have triggered your migraines in the past, avoid them. A headache diary might help you identify triggers.  Include physical activity in your daily routine. Try a daily walk or other moderate aerobic exercise.  Manage stress. Find healthy ways to cope with the stressors, such as delegating tasks on your to-do list.  Practice relaxation techniques. Try deep breathing, yoga, massage and visualization.  Eat regularly. Eating regularly scheduled meals and maintaining a healthy diet might help prevent headaches. Also, drink plenty of fluids.  Follow a regular sleep schedule. Sleep deprivation might contribute to headaches  Consider biofeedback. With this mind-body technique, you learn to control certain bodily functions -- such as muscle tension, heart rate and blood pressure -- to prevent headaches or reduce headache pain.  Headaches during pregnancy are common.  However, if you develop a severe headache or a headache that doesn't go away, call your health care provider. Severe headaches can be a sign of a pregnancy complication    Meds ordered this encounter  Medications   Fremanezumab-vfrm SOSY 225 mg    07-2024 [id number]   Ubrogepant (UBRELVY) 100 MG TABS    Sig: Take 1 tablet (100 mg total) by mouth every 2 (two) hours as needed. Maximum 200mg  a day.    Dispense:  4 tablet    Refill:  0    [id number]    Cc: No ref. provider found,  Patient, [name] Naomie Dean, MD  [facility] [address] [place], [place] [address]  I spent over 40 minutes of face-to-face and non-face-to-face time with patient on the  1. Chronic migraine without aura without status migrainosus, not intractable     diagnosis.  This included previsit chart review, lab review, study review, order entry, electronic health record documentation, patient education on the different diagnostic and therapeutic options, counseling and coordination of care, risks and benefits of management, compliance, or risk factor reduction  Phone [phone] Fax [phone]

## 2022-07-23 NOTE — Patient Instructions (Addendum)
As needed: Continue rizatriptan acutely with Bernita Raisin and alleve Preventative: Ajovy monthly Ubrelvy samples: Please take one tablet at the onset of your headache. If it does not improve the symptoms please take one additional tablet in 2 hours.   Ubrogepant Tablets What is this medication? UBROGEPANT (ue BROE je pant) treats migraines. It works by blocking a substance in the body that causes migraines. It is not used to prevent migraines. This medicine may be used for other purposes; ask your health care provider or pharmacist if you have questions. COMMON BRAND NAME(S): Bernita Raisin What should I tell my care team before I take this medication? They need to know if you have any of these conditions: Kidney disease Liver disease An unusual or allergic reaction to ubrogepant, other medications, foods, dyes, or preservatives Pregnant or trying to get pregnant Breast-feeding How should I use this medication? Take this medication by mouth with a glass of water. Take it as directed on the prescription label. You can take it with or without food. If it upsets your stomach, take it with food. Keep taking it unless your care team tells you to stop. Talk to your care team about the use of this medication in children. Special care may be needed. Overdosage: If you think you have taken too much of this medicine contact a poison control center or emergency room at once. NOTE: This medicine is only for you. Do not share this medicine with others. What if I miss a dose? This does not apply. This medication is not for regular use. What may interact with this medication? Do not take this medication with any of the following: Adagrasib Ceritinib Certain antibiotics, such as chloramphenicol, clarithromycin, telithromycin Certain antivirals for HIV, such as atazanavir, cobicistat, darunavir, delavirdine, fosamprenavir, indinavir, ritonavir Certain medications for fungal infections, such as itraconazole,  ketoconazole, posaconazole, voriconazole Conivaptan Grapefruit Idelalisib Mifepristone Nefazodone Ribociclib This medication may also interact with the following: Carvedilol Certain medications for seizures, such as phenobarbital, phenytoin Ciprofloxacin Cyclosporine Eltrombopag Fluconazole Fluvoxamine Quinidine Rifampin St. John's wort Verapamil This list may not describe all possible interactions. Give your health care provider a list of all the medicines, herbs, non-prescription drugs, or dietary supplements you use. Also tell them if you smoke, drink alcohol, or use illegal drugs. Some items may interact with your medicine. What should I watch for while using this medication? Visit your care team for regular checks on your progress. Tell your care team if your symptoms do not start to get better or if they get worse. Your mouth may get dry. Chewing sugarless gum or sucking hard candy and drinking plenty of water may help. Contact your care team if the problem does not go away or is severe. What side effects may I notice from receiving this medication? Side effects that you should report to your care team as soon as possible: Allergic reactions--skin rash, itching, hives, swelling of the face, lips, tongue, or throat Side effects that usually do not require medical attention (report to your care team if they continue or are bothersome): Drowsiness Dry mouth Fatigue Nausea This list may not describe all possible side effects. Call your doctor for medical advice about side effects. You may report side effects to FDA at 1-800-FDA-1088. Where should I keep my medication? Keep out of the reach of children and pets. Store between 15 and 30 degrees C (59 and 86 degrees F). Get rid of any unused medication after the expiration date. To get rid of medications that are no  longer needed or have expired: Take the medication to a medication take-back program. Check with your pharmacy or law  enforcement to find a location. If you cannot return the medication, check the label or package insert to see if the medication should be thrown out in the garbage or flushed down the toilet. If you are not sure, ask your care team. If it is safe to put it in the trash, pour the medication out of the container. Mix the medication with cat litter, dirt, coffee grounds, or other unwanted substance. Seal the mixture in a bag or container. Put it in the trash. NOTE: This sheet is a summary. It may not cover all possible information. If you have questions about this medicine, talk to your doctor, pharmacist, or health care provider.  2023 Elsevier/Gold Standard (2021-04-27 00:00:00)   Vernell Barrier Injection What is this medication? FREMANEZUMAB (fre ma NEZ ue mab) prevents migraines. It works by blocking a substance in the body that causes migraines. It is a monoclonal antibody. This medicine may be used for other purposes; ask your health care provider or pharmacist if you have questions. COMMON BRAND NAME(S): AJOVY What should I tell my care team before I take this medication? They need to know if you have any of these conditions: An unusual or allergic reaction to fremanezumab, other medications, foods, dyes, or preservatives Pregnant or trying to get pregnant Breast-feeding How should I use this medication? This medication is injected under the skin. You will be taught how to prepare and give it. Take it as directed on the prescription label. Keep taking it unless your care team tells you to stop. It is important that you put your used needles and syringes in a special sharps container. Do not put them in a trash can. If you do not have a sharps container, call your pharmacist or care team to get one. Talk to your care team about the use of this medication in children. Special care may be needed. Overdosage: If you think you have taken too much of this medicine contact a poison control center or  emergency room at once. NOTE: This medicine is only for you. Do not share this medicine with others. What if I miss a dose? If you miss a dose, take it as soon as you can. If it is almost time for your next dose, take only that dose. Do not take double or extra doses. What may interact with this medication? Interactions are not expected. This list may not describe all possible interactions. Give your health care provider a list of all the medicines, herbs, non-prescription drugs, or dietary supplements you use. Also tell them if you smoke, drink alcohol, or use illegal drugs. Some items may interact with your medicine. What should I watch for while using this medication? Tell your care team if your symptoms do not start to get better or if they get worse. What side effects may I notice from receiving this medication? Side effects that you should report to your care team as soon as possible: Allergic reactions or angioedema--skin rash, itching or hives, swelling of the face, eyes, lips, tongue, arms, or legs, trouble swallowing or breathing Side effects that usually do not require medical attention (report to your care team if they continue or are bothersome): Pain, redness, or irritation at injection site This list may not describe all possible side effects. Call your doctor for medical advice about side effects. You may report side effects to FDA at 1-800-FDA-1088. Where  should I keep my medication? Keep out of the reach of children and pets. Store in a refrigerator or at room temperature between 20 and 25 degrees C (68 and 77 degrees F). Refrigeration (preferred): Store in the refrigerator. Do not freeze. Keep in the original container until you are ready to take it. Remove the dose from the carton about 30 minutes before it is time for you to use it. If the dose is not used, it may be stored in the original container at room temperature for 7 days. Get rid of any unused medication after the  expiration date. Room Temperature: This medication may be stored at room temperature for up to 7 days. Keep it in the original container. Protect from light until time of use. If it is stored at room temperature, get rid of any unused medication after 7 days or after it expires, whichever is first. To get rid of medications that are no longer needed or have expired: Take the medication to a medication take-back program. Check with your pharmacy or law enforcement to find a location. If you cannot return the medication, ask your pharmacist or care team how to get rid of this medication safely. NOTE: This sheet is a summary. It may not cover all possible information. If you have questions about this medicine, talk to your doctor, pharmacist, or health care provider.  2023 Elsevier/Gold Standard (2021-05-09 00:00:00)

## 2023-02-06 ENCOUNTER — Telehealth: Payer: Self-pay | Admitting: *Deleted

## 2023-02-06 ENCOUNTER — Ambulatory Visit: Payer: 59 | Admitting: Adult Health

## 2023-02-06 ENCOUNTER — Other Ambulatory Visit: Payer: Self-pay | Admitting: *Deleted

## 2023-02-06 ENCOUNTER — Encounter: Payer: Self-pay | Admitting: Adult Health

## 2023-02-06 VITALS — BP 121/72 | HR 81 | Ht 64.0 in | Wt 137.0 lb

## 2023-02-06 DIAGNOSIS — G43709 Chronic migraine without aura, not intractable, without status migrainosus: Secondary | ICD-10-CM

## 2023-02-06 MED ORDER — QULIPTA 30 MG PO TABS: 30.0000 mg | ORAL_TABLET | Freq: Every day | ORAL | 11 refills | Status: DC

## 2023-02-06 NOTE — Telephone Encounter (Signed)
-----   Message from Butch Penny sent at 02/06/2023  3:44 PM EST ----- Start botox

## 2023-02-06 NOTE — Patient Instructions (Signed)
Your Plan:  Start Qulipta  Continue maxalt for abortive therapy If your symptoms worsen or you develop new symptoms please let us know.    Thank you for coming to see [place] at [facility]. I hope we have been able to provide you high quality care today.  You may receive a patient satisfaction survey over the next few weeks. We would appreciate your feedback and comments so that we may continue to improve ourselves and the health of our patients.

## 2023-02-06 NOTE — Progress Notes (Signed)
PATIENT: Jade Curtis DOB: [birth year]  REASON FOR VISIT: follow up HISTORY FROM: patient PRIMARY NEUROLOGIST: Dr. Lucia Gaskins   Chief Complaint  Patient presents with   Follow-up    Pt in 19  Pt here for Migraines Pt states 20 migraines in last month Pt states  Pt states out of samples of Ubrevly and  rizatriptan not working well  Pt states need other medication options to treat migraines      HISTORY OF PRESENT ILLNESS: Today 02/06/23  Jade Curtis is a 27 y.o. female who has been followed in this office for Migraine headaches. Returns today for follow-up.  She states that she only did 1 month of Ajovy and this was the sample given in the office.  She also took [place] but did not find it beneficial.  Reports that she is having about 15 headache days a month.  Sometimes has photophobia and phonophobia.  Reports some nausea but no vomiting.  Maxalt has been beneficial but there are times that she needs a second dose.  She is  breast-feeding.  Returns today for follow-up.  She has tried Topamax for several years with side effects, Depakote for several years(gained excessive weight), Zomig, Maxalt (still takes and it works well for her), Imitrex nasal spray, Ibuprofen, Tylenol, Excedrin migraine, Aleve, Tylenol cold and sinus, Sudafed, amitriptyline(excessive fatigue), diclofenac, flexeril, verapamil(side effect hypotension), propranolol (side effect hypotension), tried Ajovy, reglan, topiramate, nurtec, ubrelvy, aimovig is contraindicated due to constipation   HISTORY She is getting migraines now since giving birth. Did great during pregnancy but now having >8 migraines a month and > 15 total headache days a month.Maxalt worked acutely.  We discussed multiple options since patient is lactating, I showed her NIH lactmed med, I printed off documentation on every medication that she is taking, we talked about the risks, nothing is safe in lactation or pregnancy, but some are thought to be  more safe than others, Ajovy and Bernita Raisin are mostly protein bound unlikely to affect the infant but I did print her out all the information found in NIH lactation medication database on all these medications.  We discussed in detail.  Decided to start Ajovy.  She could not get pregnant for 6 months after starting Ajovy.  We discussed teratogenicity.  Also gave her Ubrelvy samples.  She can use rizatriptan in the meantime.   Patient complains of symptoms per HPI as well as the following symptoms: migraine during lactation . Pertinent negatives and positives per HPI. All others negative     01/01/2022: She found out the middle of June she was pregnant. The first 2 weeks of July it was very bad but since then she has not really had a migraine.   Patient complains of symptoms per HPI as well as the following symptoms: migraines . Pertinent negatives and positives per HPI. All others negative     HPI 06/13/2020:  Jade Curtis is a 27 y.o. female here as requested by No ref. provider found for migraines. Since a child. They were wors ein elementary school, vomiting, went to many urgent cares as a child. Over the years she ahs learned how to handle it better. She tries to drink water. She had to take a maxalt every day last week, weather triggered more migraines, also her period can trigger a migraine. Starts unilaterally, pulsating/pounding/throbbing, osmophobia, nausea, can last 24 hours, a strong scent can make it worse, she used to vomit. Over the last 6 months, she has  15 headache days a month and 8 migraine days a month that are moderately severe to severe, no aura, no medication overuse. She is not planning on children anytime soon, however she is no on birth control and so this may limit our choice of medications. No other focal neurologic deficits, associated symptoms, inciting events or modifiable factors.No vision changes. Not positional. No new symptoms, quality stable.    She has tried Topamax  for several years with side effects, Depakote for several years(gained excessive weight), Zomig, Maxalt (still takes and it works well for her), Imitrex nasal spray, Ibuprofen, Tylenol, Excedrin migraine, Aleve, Tylenol cold and sinus, Sudafed, amitriptyline(excessive fatigue), diclofenac, flexeril, verapamil(side effect hypotension), propranolol (side effect hypotension), tried Ajovy, reglan, topiramate, nurtec, ubrelvy, aimovig is contraindicated due to constipation   Reviewed notes, labs and imaging from outside physicians, which showed: see above  REVIEW OF SYSTEMS: Out of a complete 14 system review of symptoms, the patient complains only of the following symptoms, and all other reviewed systems are negative.  ALLERGIES: Allergies  Allergen Reactions   Codeine     childhood       HOME MEDICATIONS: Outpatient Medications Prior to Visit  Medication Sig Dispense Refill   acetaminophen (TYLENOL) 325 MG tablet Take 2 tablets (650 mg total) by mouth every 4 (four) hours as needed (for pain scale < 4). 60 tablet 1   amphetamine-dextroamphetamine (ADDERALL XR) 30 MG 24 hr capsule Take 30 mg by mouth daily.     amphetamine-dextroamphetamine (ADDERALL) 15 MG tablet Take 1 tablet by mouth daily.     Doxylamine Succinate, Sleep, (UNISOM PO) Take by mouth at bedtime as needed.     ibuprofen (ADVIL) 600 MG tablet Take 1 tablet (600 mg total) by mouth every 6 (six) hours. 30 tablet 0   MAGNESIUM PO Take by mouth.     metoCLOPramide (REGLAN) 10 MG tablet Take 1 tablet (10 mg total) by mouth 4 (four) times daily. As needed for migraine. 30 tablet 3   Prenatal Vit-Fe Fumarate-FA (PRENATAL VITAMINS PO) Take by mouth.     rizatriptan (MAXALT-MLT) 10 MG disintegrating tablet Take 1 tablet (10 mg total) by mouth as needed for migraine. May repeat in 2 hours if needed 12 tablet 11   Ubrogepant (UBRELVY) 100 MG TABS Take 1 tablet (100 mg total) by mouth every 2 (two) hours as needed. Maximum 200mg  a day. 4  tablet 0   Facility-Administered Medications Prior to Visit  Medication Dose Route Frequency Provider Last Rate Last Admin   Fremanezumab-vfrm SOSY 225 mg  225 mg Subcutaneous Once Anson Fret, MD        PAST MEDICAL HISTORY: Past Medical History:  Diagnosis Date   ADHD (attention deficit hyperactivity disorder)    Migraines     PAST SURGICAL HISTORY: Past Surgical History:  Procedure Laterality Date   NO PAST SURGERIES     TONSILLECTOMY      FAMILY HISTORY: Family History  Problem Relation Age of Onset   Migraines Mother    Kidney disease Father    Migraines Maternal Grandmother     SOCIAL HISTORY: Social History   Socioeconomic History   Marital status: Married    Spouse name: Not on file   Number of children: Not on file   Years of education: Not on file   Highest education level: Not on file  Occupational History   Not on file  Tobacco Use   Smoking status: Never   Smokeless tobacco: Never  Vaping  Use   Vaping status: Never Used  Substance and Sexual Activity   Alcohol use: Not Currently    Comment: occasionally    Drug use: Not Currently    Types: Marijuana   Sexual activity: Not Currently  Other Topics Concern   Not on file  Social History Narrative   Lives with her husband    Right handed   Caffeine: coffee and sometimes diet coke in afternoon   Social Determinants of Health   Financial Resource Strain: Not on file  Food Insecurity: No Food Insecurity (05/23/2022)   Hunger Vital Sign    Worried About Running Out of Food in the Last Year: Never true    Ran Out of Food in the Last Year: Never true  Transportation Needs: No Transportation Needs (05/23/2022)   PRAPARE - Administrator, Civil Service (Medical): No    Lack of Transportation (Non-Medical): No  Physical Activity: Not on file  Stress: Not on file  Social Connections: Not on file  Intimate Partner Violence: Not At Risk (05/24/2022)   Humiliation, Afraid, Rape, and  Kick questionnaire    Fear of Current or Ex-Partner: No    Emotionally Abused: No    Physically Abused: No    Sexually Abused: No      PHYSICAL EXAM  Vitals:   02/06/23 1436  BP: 121/72  Pulse: 81  Weight: 137 lb (62.1 kg)  Height: 5\' 4"  (1.626 m)   Body mass index is 23.52 kg/m.  Generalized: Well developed, in no acute distress   Neurological examination  Mentation: Alert oriented to time, place, history taking. Follows all commands speech and language fluent Cranial nerve II-XII: Pupils were equal round reactive to light. Extraocular movements were full, visual field were full on confrontational test. Facial sensation and strength were normal. Uvula tongue midline. Head turning and shoulder shrug  were normal and symmetric. Motor: The motor testing reveals 5 over 5 strength of all 4 extremities. Good symmetric motor tone is noted throughout.  Sensory: Sensory testing is intact to soft touch on all 4 extremities. No evidence of extinction is noted.  Coordination: Cerebellar testing reveals good finger-nose-finger and heel-to-shin bilaterally.  Gait and station: Gait is normal. T  DIAGNOSTIC DATA (LABS, IMAGING, TESTING) - I reviewed patient records, labs, notes, testing and imaging myself where available.  Lab Results  Component Value Date   WBC 18.6 (H) 05/24/2022   HGB 10.1 (L) 05/24/2022   HCT 29.1 (L) 05/24/2022   MCV 90.7 05/24/2022   PLT 249 05/24/2022      Component Value Date/Time   NA 142 02/28/2013 2345   K 3.5 02/28/2013 2345   CL 107 02/28/2013 2345   CO2 25 04/27/2007 1524   GLUCOSE 101 (H) 02/28/2013 2345   BUN 12 02/28/2013 2345   CREATININE 1.00 02/28/2013 2345   CALCIUM 9.2 04/27/2007 1524   GFRNONAA NOT CALCULATED 04/27/2007 1524   GFRAA  04/27/2007 1524    NOT CALCULATED        The eGFR has been calculated using the MDRD equation. This calculation has not been validated in all clinical     ASSESSMENT AND PLAN 27 y.o. year old female   has a past medical history of ADHD (attention deficit hyperactivity disorder) and Migraines. here with:  Migraine headaches   Discussed medication options with the patient.  Advised that there are no medications that are listed as being completely safe in pregnancy.  There is some data that supports Nurtec,  Emgality, Ajovy having low transmission to breastmilk.  There is also data that support that Botox was not present in breastmilk (LACTMED) Patient states that she has never tried Botox before and would be willing to try this.  We will work on getting this approved.  She will continue on rizatriptan for abortive therapy.  Will get her scheduled for Botox once approved     Butch Penny, MSN, NP-C 02/06/2023, 2:12 PM [facility] [address], [address] [place], [place] [address] [phone]

## 2023-02-06 NOTE — Telephone Encounter (Signed)
Chronic Migraine CPT 64615  Botox J0585 Units:200  G43.709 Chronic Migraine without aura, not intractable, without status migrainous   

## 2023-02-07 NOTE — Telephone Encounter (Signed)
Initiated benefit verification, will update once results are received. [id number]

## 2023-02-13 NOTE — Telephone Encounter (Signed)
Completed [employer] PA form and faxed along with OV notes to [phone].

## 2023-02-20 NOTE — Telephone Encounter (Signed)
[employer] states Berkley Harvey can take up to 15 calendar days for processing.

## 2023-03-04 NOTE — Telephone Encounter (Signed)
I called to check status of auth, spoke with Beth N. She states the fax I sent over on 11/13 had not been received. She instructed me to submit via email to [email or web address], review supervisor. I emailed auth and am waiting to hear back.

## 2023-03-07 NOTE — Telephone Encounter (Addendum)
I did not hear back from either manager at [employer]. I submitted request via [employer] since they seem to be having issues receiving auths via fax. Status is pending, will update once decision has been made. Reference # [id number].

## 2023-03-11 MED ORDER — ONABOTULINUMTOXINA 200 UNITS IJ SOLR: INTRAMUSCULAR | 3 refills | Status: DC

## 2023-03-11 NOTE — Addendum Note (Signed)
Addended by: Bertram Savin on: 03/11/2023 02:25 PM   Modules accepted: Orders

## 2023-03-11 NOTE — Telephone Encounter (Signed)
Botox 200 unit Rx sent to [facility] in [place].

## 2023-03-11 NOTE — Telephone Encounter (Signed)
Received fax from [facility] requesting additional information. Completed clinical questions and faxed notes to [phone]. Attn to [facility], Therapist, art # [id number].

## 2023-03-11 NOTE — Telephone Encounter (Signed)
Received fax of approval from [facility], please send rx to [facility].  Auth#: [id number] (03/11/23-09/08/23)

## 2023-04-23 ENCOUNTER — Telehealth: Payer: Self-pay | Admitting: Adult Health

## 2023-04-23 NOTE — Telephone Encounter (Signed)
Unable to reach pt over the phone, VM box full. Sent mychart msg informing pt of appt change on 05/08/23 - NP schedule change

## 2023-05-01 MED ORDER — ONABOTULINUMTOXINA 200 UNITS IJ SOLR: INTRAMUSCULAR | 3 refills | Status: AC

## 2023-05-01 NOTE — Telephone Encounter (Signed)
Botox 200 unit Rx sent to [facility] again.

## 2023-05-01 NOTE — Telephone Encounter (Signed)
Can you please resend rx to [facility]? For some reason it looks like they never received.

## 2023-05-01 NOTE — Addendum Note (Signed)
Addended by: Bertram Savin on: 05/01/2023 09:25 AM   Modules accepted: Orders

## 2023-05-08 ENCOUNTER — Ambulatory Visit: Payer: 59 | Admitting: Adult Health

## 2023-05-09 NOTE — Telephone Encounter (Signed)
 Received fax from [facility] that Botox  is TBD 2/11.

## 2023-05-13 NOTE — Telephone Encounter (Signed)
 Called patient, patient scheduled for Botox  injection with Megan for 05/16/23 at 8:15am

## 2023-05-14 DIAGNOSIS — G43709 Chronic migraine without aura, not intractable, without status migrainosus: Secondary | ICD-10-CM | POA: Diagnosis not present

## 2023-05-16 ENCOUNTER — Ambulatory Visit: Payer: 59 | Admitting: Adult Health

## 2023-05-16 DIAGNOSIS — G43709 Chronic migraine without aura, not intractable, without status migrainosus: Secondary | ICD-10-CM | POA: Diagnosis not present

## 2023-05-16 MED ORDER — ONABOTULINUMTOXINA 200 UNITS IJ SOLR
155.0000 [IU] | Freq: Once | INTRAMUSCULAR | Status: AC
  Administered 2023-05-16: 155 [IU] via INTRAMUSCULAR

## 2023-05-16 NOTE — Progress Notes (Signed)
Botox- 200 units x 1 vial Lot: [id number] Expiration: 07/2025 NDC: 4782-9562-13  Bacteriostatic 0.9% Sodium Chloride- * mL  Lot: [id number] Expiration: 02/01/2024 NDC: 4696-2952-84  Dx: 43.709 S/P  Witnessed by Alverda Skeans, RN

## 2023-05-16 NOTE — Progress Notes (Signed)
2/13: having at least 15 headache days a month.  Has tried and failed multiple medications in the past.  We reviewed Botox procedure today.  Reviewed potential side effects and contraindications.  Patient is still breast-feeding.  Reviewed LACTMED in regarding to botox and breastfeeding. Also discussed with Dr. Lucia Gaskins- who agreed that botox while breastfeeding posing minimal risk to baby.  patient would like to proceed with Botox injections today    BOTOX PROCEDURE NOTE FOR MIGRAINE HEADACHE    Contraindications and precautions discussed with patient(above). Aseptic procedure was observed and patient tolerated procedure. Procedure performed by Butch Penny, NP  The condition has existed for more than 6 months, and pt does not have a diagnosis of ALS, Myasthenia Gravis or Lambert-Eaton Syndrome.  Risks and benefits of injections discussed and pt agrees to proceed with the procedure.  Written consent obtained  These injections are medically necessary These injections do not cause sedations or hallucinations which the oral therapies may cause.  Indication/Diagnosis: chronic migraine BOTOX(J0585) injection was performed according to protocol by [facility]. 200 units of BOTOX was dissolved into 4 cc NS.   NDC: [address]-40  Type of toxin: Botox  Botox- 200 units x 1 vial Lot: [id number] Expiration: 07/2025 NDC: 8295-6213-08   Bacteriostatic 0.9% Sodium Chloride- * mL  Lot: [id number] Expiration: 02/01/2024 NDC: 9629-5284-13   Dx: 43.709    Description of procedure:  The patient was placed in a sitting position. The standard protocol was used for Botox as follows, with 5 units of Botox injected at each site:   -Procerus muscle, midline injection  -Corrugator muscle, bilateral injection  -Frontalis muscle, bilateral injection, with 2 sites each side, medial injection was performed in the upper one third of the frontalis muscle, in the region vertical from the medial inferior edge  of the superior orbital rim. The lateral injection was again in the upper one third of the forehead vertically above the lateral limbus of the cornea, 1.5 cm lateral to the medial injection site.  -Temporalis muscle injection, 4 sites, bilaterally. The first injection was 3 cm above the tragus of the ear, second injection site was 1.5 cm to 3 cm up from the first injection site in line with the tragus of the ear. The third injection site was 1.5-3 cm forward between the first 2 injection sites. The fourth injection site was 1.5 cm posterior to the second injection site.  -Occipitalis muscle injection, 3 sites, bilaterally. The first injection was done one half way between the occipital protuberance and the tip of the mastoid process behind the ear. The second injection site was done lateral and superior to the first, 1 fingerbreadth from the first injection. The third injection site was 1 fingerbreadth superiorly and medially from the first injection site.  -Cervical paraspinal muscle injection, 2 sites, bilateral knee first injection site was 1 cm from the midline of the cervical spine, 3 cm inferior to the lower border of the occipital protuberance. The second injection site was 1.5 cm superiorly and laterally to the first injection site.  -Trapezius muscle injection was performed at 3 sites, bilaterally. The first injection site was in the upper trapezius muscle halfway between the inflection point of the neck, and the acromion. The second injection site was one half way between the acromion and the first injection site. The third injection was done between the first injection site and the inflection point of the neck.   Will return for repeat injection in 3 months.   A  200 unit sof Botox was used, 155 units were injected, the rest of the Botox was wasted. The patient tolerated the procedure well, there were no complications of the above procedure.  Butch Penny, MSN, NP-C 05/16/2023, 8:28  AM [facility] [address], [address] [place], [place] [address] [phone]

## 2023-06-19 ENCOUNTER — Other Ambulatory Visit: Payer: Self-pay | Admitting: Neurology

## 2023-07-05 DIAGNOSIS — F902 Attention-deficit hyperactivity disorder, combined type: Secondary | ICD-10-CM | POA: Diagnosis not present

## 2023-07-05 DIAGNOSIS — Z79899 Other long term (current) drug therapy: Secondary | ICD-10-CM | POA: Diagnosis not present

## 2023-08-06 DIAGNOSIS — G43709 Chronic migraine without aura, not intractable, without status migrainosus: Secondary | ICD-10-CM | POA: Diagnosis not present

## 2023-08-18 NOTE — Progress Notes (Deleted)
 2/13: having at least 15 headache days a month.  Has tried and failed multiple medications in the past.  We reviewed Botox  procedure today.  Reviewed potential side effects and contraindications.  Patient is still breast-feeding.  Reviewed LACTMED in regarding to botox  and breastfeeding. Also discussed with Dr. Tresia Fruit- who agreed that botox  while breastfeeding posing minimal risk to baby.  patient would like to proceed with Botox  injections today    BOTOX  PROCEDURE NOTE FOR MIGRAINE HEADACHE    Contraindications and precautions discussed with patient(above). Aseptic procedure was observed and patient tolerated procedure. Procedure performed by Clem Currier, NP  The condition has existed for more than 6 months, and pt does not have a diagnosis of ALS, Myasthenia Gravis or Lambert-Eaton Syndrome.  Risks and benefits of injections discussed and pt agrees to proceed with the procedure.  Written consent obtained  These injections are medically necessary These injections do not cause sedations or hallucinations which the oral therapies may cause.  Indication/Diagnosis: chronic migraine BOTOX (Z6109) injection was performed according to protocol by [facility]. 200 units of BOTOX  was dissolved into 4 cc NS.   NDC: [address]-19  Type of toxin: Botox   Botox - 200 units x 1 vial Lot: [id number] Expiration: 07/2025 NDC: 2130-8657-84   Bacteriostatic 0.9% Sodium Chloride - * mL  Lot: [id number] Expiration: 02/01/2024 NDC: 2841-3244-01   Dx: 43.709    Description of procedure:  The patient was placed in a sitting position. The standard protocol was used for Botox  as follows, with 5 units of Botox  injected at each site:   -Procerus muscle, midline injection  -Corrugator muscle, bilateral injection  -Frontalis muscle, bilateral injection, with 2 sites each side, medial injection was performed in the upper one third of the frontalis muscle, in the region vertical from the medial inferior edge  of the superior orbital rim. The lateral injection was again in the upper one third of the forehead vertically above the lateral limbus of the cornea, 1.5 cm lateral to the medial injection site.  -Temporalis muscle injection, 4 sites, bilaterally. The first injection was 3 cm above the tragus of the ear, second injection site was 1.5 cm to 3 cm up from the first injection site in line with the tragus of the ear. The third injection site was 1.5-3 cm forward between the first 2 injection sites. The fourth injection site was 1.5 cm posterior to the second injection site.  -Occipitalis muscle injection, 3 sites, bilaterally. The first injection was done one half way between the occipital protuberance and the tip of the mastoid process behind the ear. The second injection site was done lateral and superior to the first, 1 fingerbreadth from the first injection. The third injection site was 1 fingerbreadth superiorly and medially from the first injection site.  -Cervical paraspinal muscle injection, 2 sites, bilateral knee first injection site was 1 cm from the midline of the cervical spine, 3 cm inferior to the lower border of the occipital protuberance. The second injection site was 1.5 cm superiorly and laterally to the first injection site.  -Trapezius muscle injection was performed at 3 sites, bilaterally. The first injection site was in the upper trapezius muscle halfway between the inflection point of the neck, and the acromion. The second injection site was one half way between the acromion and the first injection site. The third injection was done between the first injection site and the inflection point of the neck.   Will return for repeat injection in 3 months.   A  200 unit sof Botox  was used, 155 units were injected, the rest of the Botox  was wasted. The patient tolerated the procedure well, there were no complications of the above procedure.  Clem Currier, MSN, NP-C 08/18/2023, 2:28  PM [facility] [address], [address] [place], [place] [address] [phone]

## 2023-08-19 ENCOUNTER — Ambulatory Visit: Payer: 59 | Admitting: Adult Health

## 2023-08-19 ENCOUNTER — Encounter: Payer: Self-pay | Admitting: Adult Health

## 2023-09-02 DIAGNOSIS — Z0289 Encounter for other administrative examinations: Secondary | ICD-10-CM

## 2023-09-03 ENCOUNTER — Ambulatory Visit: Admitting: Adult Health

## 2023-09-03 VITALS — BP 105/63 | HR 77

## 2023-09-03 DIAGNOSIS — G43709 Chronic migraine without aura, not intractable, without status migrainosus: Secondary | ICD-10-CM

## 2023-09-03 MED ORDER — ONABOTULINUMTOXINA 200 UNITS IJ SOLR
155.0000 [IU] | Freq: Once | INTRAMUSCULAR | Status: AC
  Administered 2023-09-03: 155 [IU] via INTRAMUSCULAR

## 2023-09-03 NOTE — Progress Notes (Signed)
 Botox - 200 units x 1 vial Lot: [id number] Expiration: 12/2025 NDC: 5409-8119-14  Bacteriostatic 0.9% Sodium Chloride - 4  mL  Lot: [id number] Expiration: 09/2024 NDC: 2130-8657-84  Dx: O96.295 S/P Witnessed by Lurena Sally

## 2023-09-03 NOTE — Progress Notes (Signed)
 09/03/23: 2 headaches a week about 8 headaches a month.  There has been a reduction in her headache.  She continues to breast-feed.  Reports that she tolerated the last injections well.   2/13: having at least 15 headache days a month.  Has tried and failed multiple medications in the past.  We reviewed Botox  procedure today.  Reviewed potential side effects and contraindications.  Patient is still breast-feeding.  Reviewed LACTMED in regarding to botox  and breastfeeding. Also discussed with Dr. Tresia Fruit- who agreed that botox  while breastfeeding posing minimal risk to baby.  patient would like to proceed with Botox  injections today    BOTOX  PROCEDURE NOTE FOR MIGRAINE HEADACHE    Contraindications and precautions discussed with patient(above). Aseptic procedure was observed and patient tolerated procedure. Procedure performed by Clem Currier, NP  The condition has existed for more than 6 months, and pt does not have a diagnosis of ALS, Myasthenia Gravis or Lambert-Eaton Syndrome.  Risks and benefits of injections discussed and pt agrees to proceed with the procedure.  Written consent obtained  These injections are medically necessary These injections do not cause sedations or hallucinations which the oral therapies may cause.  Indication/Diagnosis: chronic migraine BOTOX (U9811) injection was performed according to protocol by [facility]. 200 units of BOTOX  was dissolved into 4 cc NS.   NDC: [address]-21  Type of toxin: Botox   Botox - 200 units x 1 vial Lot: [id number] Expiration: 12/2025 NDC: 3086-5784-69   Bacteriostatic 0.9% Sodium Chloride - 4  mL  Lot: [id number] Expiration: 09/2024 NDC: 4132-4401-02   Dx: V25.366    Description of procedure:  The patient was placed in a sitting position. The standard protocol was used for Botox  as follows, with 5 units of Botox  injected at each site:   -Procerus muscle, midline injection  -Corrugator muscle, bilateral injection  -Frontalis  muscle, bilateral injection, with 2 sites each side, medial injection was performed in the upper one third of the frontalis muscle, in the region vertical from the medial inferior edge of the superior orbital rim. The lateral injection was again in the upper one third of the forehead vertically above the lateral limbus of the cornea, 1.5 cm lateral to the medial injection site.  -Temporalis muscle injection, 4 sites, bilaterally. The first injection was 3 cm above the tragus of the ear, second injection site was 1.5 cm to 3 cm up from the first injection site in line with the tragus of the ear. The third injection site was 1.5-3 cm forward between the first 2 injection sites. The fourth injection site was 1.5 cm posterior to the second injection site.  -Occipitalis muscle injection, 3 sites, bilaterally. The first injection was done one half way between the occipital protuberance and the tip of the mastoid process behind the ear. The second injection site was done lateral and superior to the first, 1 fingerbreadth from the first injection. The third injection site was 1 fingerbreadth superiorly and medially from the first injection site.  -Cervical paraspinal muscle injection, 2 sites, bilateral knee first injection site was 1 cm from the midline of the cervical spine, 3 cm inferior to the lower border of the occipital protuberance. The second injection site was 1.5 cm superiorly and laterally to the first injection site.  -Trapezius muscle injection was performed at 3 sites, bilaterally. The first injection site was in the upper trapezius muscle halfway between the inflection point of the neck, and the acromion. The second injection site was one half way between the acromion  and the first injection site. The third injection was done between the first injection site and the inflection point of the neck.   Will return for repeat injection in 3 months.   A 200 unit sof Botox  was used, 155 units were  injected, the rest of the Botox  was wasted. The patient tolerated the procedure well, there were no complications of the above procedure.  Clem Currier, MSN, NP-C 09/03/2023, 1:54 PM [facility] [address], [address] [place], [place] [address] [phone]

## 2023-09-04 ENCOUNTER — Telehealth: Payer: Self-pay | Admitting: Adult Health

## 2023-09-04 NOTE — Telephone Encounter (Signed)
 Casey from [facility] called in regard to get PA for Pt Botox ,  Callback [phone]

## 2023-09-06 NOTE — Telephone Encounter (Signed)
 Submitted [facility] request via [employer], status is pending with reference # [id number].

## 2023-09-16 NOTE — Telephone Encounter (Signed)
 Received approval, pt will continue to fill through [facility]. Auth#: [id number] (09/09/23-09/07/24)

## 2023-11-11 ENCOUNTER — Ambulatory Visit: Admitting: Adult Health

## 2023-11-13 DIAGNOSIS — G43709 Chronic migraine without aura, not intractable, without status migrainosus: Secondary | ICD-10-CM | POA: Diagnosis not present

## 2023-12-02 NOTE — Progress Notes (Unsigned)
 12/03/23: reports that botox  is working well.  Reports no headaches in the last 2 weeks.  Only getting approximately 1 migraine near her menstrual cycle.  Typically can take rizatriptan  with good benefit.  Currently still breast-feeding but trying to wean off.  09/03/23: 2 headaches a week about 8 headaches a month.  There has been a reduction in her headache.  She continues to breast-feed.  Reports that she tolerated the last injections well.  2/13: having at least 15 headache days a month.  Has tried and failed multiple medications in the past.  We reviewed Botox  procedure today.  Reviewed potential side effects and contraindications.  Patient is still breast-feeding.  Reviewed LACTMED in regarding to botox  and breastfeeding. Also discussed with Dr. Ines- who agreed that botox  while breastfeeding posing minimal risk to baby.  patient would like to proceed with Botox  injections today    BOTOX  PROCEDURE NOTE FOR MIGRAINE HEADACHE    Contraindications and precautions discussed with patient(above). Aseptic procedure was observed and patient tolerated procedure. Procedure performed by Duwaine Russell, NP  The condition has existed for more than 6 months, and pt does not have a diagnosis of ALS, Myasthenia Gravis or Lambert-Eaton Syndrome.  Risks and benefits of injections discussed and pt agrees to proceed with the procedure.  Written consent obtained  These injections are medically necessary These injections do not cause sedations or hallucinations which the oral therapies may cause.  Indication/Diagnosis: chronic migraine BOTOX (G9414) injection was performed according to protocol by [facility]. 200 units of BOTOX  was dissolved into 4 cc NS.   NDC: [address]-98  Type of toxin: Botox     Botox - 200 units x 1 vial Lot: [id number] Expiration: 06/2026 NDC: 9976-6078-97   Bacteriostatic 0.9% Sodium Chloride - 4 mL  Lot: [id number] Expiration: 01/30/2025 NDC: 9590-8033-97   Dx: H56.290 S/P          Description of procedure:  The patient was placed in a sitting position. The standard protocol was used for Botox  as follows, with 5 units of Botox  injected at each site:   -Procerus muscle, midline injection  -Corrugator muscle, bilateral injection  -Frontalis muscle, bilateral injection, with 2 sites each side, medial injection was performed in the upper one third of the frontalis muscle, in the region vertical from the medial inferior edge of the superior orbital rim. The lateral injection was again in the upper one third of the forehead vertically above the lateral limbus of the cornea, 1.5 cm lateral to the medial injection site.  -Temporalis muscle injection, 4 sites, bilaterally. The first injection was 3 cm above the tragus of the ear, second injection site was 1.5 cm to 3 cm up from the first injection site in line with the tragus of the ear. The third injection site was 1.5-3 cm forward between the first 2 injection sites. The fourth injection site was 1.5 cm posterior to the second injection site.  -Occipitalis muscle injection, 3 sites, bilaterally. The first injection was done one half way between the occipital protuberance and the tip of the mastoid process behind the ear. The second injection site was done lateral and superior to the first, 1 fingerbreadth from the first injection. The third injection site was 1 fingerbreadth superiorly and medially from the first injection site.  -Cervical paraspinal muscle injection, 2 sites, bilateral knee first injection site was 1 cm from the midline of the cervical spine, 3 cm inferior to the lower border of the occipital protuberance. The second injection site was 1.5 cm  superiorly and laterally to the first injection site.  -Trapezius muscle injection was performed at 3 sites, bilaterally. The first injection site was in the upper trapezius muscle halfway between the inflection point of the neck, and the acromion. The second injection  site was one half way between the acromion and the first injection site. The third injection was done between the first injection site and the inflection point of the neck.   Will return for repeat injection in 3 months.   A 200 unit sof Botox  was used, 155 units were injected, the rest of the Botox  was wasted. The patient tolerated the procedure well, there were no complications of the above procedure.  Duwaine Russell, MSN, NP-C 12/02/2023, 9:38 AM [facility] [address] [place], [place] [address] [phone]

## 2023-12-03 ENCOUNTER — Ambulatory Visit: Admitting: Adult Health

## 2023-12-03 VITALS — BP 119/74 | HR 86

## 2023-12-03 DIAGNOSIS — G43709 Chronic migraine without aura, not intractable, without status migrainosus: Secondary | ICD-10-CM

## 2023-12-03 MED ORDER — ONABOTULINUMTOXINA 200 UNITS IJ SOLR
155.0000 [IU] | Freq: Once | INTRAMUSCULAR | Status: AC
  Administered 2023-12-03: 155 [IU] via INTRAMUSCULAR

## 2023-12-03 NOTE — Progress Notes (Signed)
 Botox - 200 units x 1 vial Lot: [id number] Expiration: 06/2026 NDC: 9976-6078-97  Bacteriostatic 0.9% Sodium Chloride - 4 mL  Lot: [id number] Expiration: 01/30/2025 NDC: 9590-8033-97  Dx: H56.290 S/P  Witnessed by Nena GRADE RN Botox  consent signed

## 2023-12-10 DIAGNOSIS — R102 Pelvic and perineal pain: Secondary | ICD-10-CM | POA: Diagnosis not present

## 2024-01-07 DIAGNOSIS — N946 Dysmenorrhea, unspecified: Secondary | ICD-10-CM | POA: Diagnosis not present

## 2024-01-07 DIAGNOSIS — N920 Excessive and frequent menstruation with regular cycle: Secondary | ICD-10-CM | POA: Diagnosis not present

## 2024-02-18 DIAGNOSIS — Z3200 Encounter for pregnancy test, result unknown: Secondary | ICD-10-CM | POA: Diagnosis not present

## 2024-03-03 DIAGNOSIS — G43709 Chronic migraine without aura, not intractable, without status migrainosus: Secondary | ICD-10-CM | POA: Diagnosis not present

## 2024-03-06 DIAGNOSIS — Z3201 Encounter for pregnancy test, result positive: Secondary | ICD-10-CM | POA: Diagnosis not present

## 2024-03-06 DIAGNOSIS — F419 Anxiety disorder, unspecified: Secondary | ICD-10-CM | POA: Diagnosis not present

## 2024-03-06 DIAGNOSIS — Z23 Encounter for immunization: Secondary | ICD-10-CM | POA: Diagnosis not present

## 2024-03-09 ENCOUNTER — Telehealth: Admitting: Adult Health

## 2024-03-09 ENCOUNTER — Telehealth: Payer: Self-pay | Admitting: Adult Health

## 2024-03-09 ENCOUNTER — Ambulatory Visit: Admitting: Adult Health

## 2024-03-09 NOTE — Telephone Encounter (Signed)
 Patient called to cancel appointment due to will not able to do MyChart Video Visit. Informed patient would be a no show fee of $50. Patient verbalized understand, in mist of transferring to Billing patient hung up.
